# Patient Record
Sex: Female | Born: 1955 | Race: White | Hispanic: No | Marital: Married | Smoking: Never smoker
Health system: Southern US, Community
[De-identification: ages and names within clinical notes are randomized; demographics above are authoritative.]

## PROBLEM LIST (undated history)

## (undated) DIAGNOSIS — E162 Hypoglycemia, unspecified: Secondary | ICD-10-CM

## (undated) DIAGNOSIS — F32A Depression, unspecified: Secondary | ICD-10-CM

## (undated) DIAGNOSIS — R0981 Nasal congestion: Secondary | ICD-10-CM

## (undated) DIAGNOSIS — F329 Major depressive disorder, single episode, unspecified: Secondary | ICD-10-CM

## (undated) DIAGNOSIS — K589 Irritable bowel syndrome without diarrhea: Secondary | ICD-10-CM

## (undated) HISTORY — DX: Depression, unspecified: F32.A

## (undated) HISTORY — PX: TOTAL HIP ARTHROPLASTY: SHX124

## (undated) HISTORY — DX: Major depressive disorder, single episode, unspecified: F32.9

---
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
## 1985-07-06 HISTORY — PX: EAR CANALOPLASTY: SHX1481

## 1998-06-20 ENCOUNTER — Ambulatory Visit (HOSPITAL_BASED_OUTPATIENT_CLINIC_OR_DEPARTMENT_OTHER): Admission: RE | Admit: 1998-06-20 | Discharge: 1998-06-20 | Payer: Self-pay | Admitting: *Deleted

## 1998-07-06 HISTORY — PX: TYMPANOMASTOIDECTOMY: SHX34

## 1999-05-18 ENCOUNTER — Emergency Department (HOSPITAL_COMMUNITY): Admission: EM | Admit: 1999-05-18 | Discharge: 1999-05-18 | Payer: Self-pay | Admitting: Emergency Medicine

## 1999-05-19 ENCOUNTER — Encounter: Payer: Self-pay | Admitting: Emergency Medicine

## 1999-08-18 ENCOUNTER — Emergency Department (HOSPITAL_COMMUNITY): Admission: EM | Admit: 1999-08-18 | Discharge: 1999-08-19 | Payer: Self-pay | Admitting: Emergency Medicine

## 1999-08-19 ENCOUNTER — Encounter: Payer: Self-pay | Admitting: Emergency Medicine

## 2000-11-28 ENCOUNTER — Emergency Department (HOSPITAL_COMMUNITY): Admission: EM | Admit: 2000-11-28 | Discharge: 2000-11-28 | Payer: Self-pay | Admitting: Emergency Medicine

## 2003-07-07 HISTORY — PX: OTHER SURGICAL HISTORY: SHX169

## 2003-11-15 ENCOUNTER — Ambulatory Visit (HOSPITAL_COMMUNITY): Admission: RE | Admit: 2003-11-15 | Discharge: 2003-11-15 | Payer: Self-pay | Admitting: *Deleted

## 2004-01-08 ENCOUNTER — Other Ambulatory Visit: Admission: RE | Admit: 2004-01-08 | Discharge: 2004-01-08 | Payer: Self-pay | Admitting: Obstetrics and Gynecology

## 2004-04-06 ENCOUNTER — Emergency Department: Payer: Self-pay | Admitting: Emergency Medicine

## 2004-05-13 ENCOUNTER — Ambulatory Visit: Payer: Self-pay | Admitting: Family Medicine

## 2005-10-14 ENCOUNTER — Inpatient Hospital Stay (HOSPITAL_COMMUNITY): Admission: RE | Admit: 2005-10-14 | Discharge: 2005-10-17 | Payer: Self-pay | Admitting: Orthopedic Surgery

## 2005-11-10 ENCOUNTER — Encounter: Payer: Self-pay | Admitting: Orthopedic Surgery

## 2005-12-04 ENCOUNTER — Encounter: Payer: Self-pay | Admitting: Orthopedic Surgery

## 2006-03-14 ENCOUNTER — Ambulatory Visit (HOSPITAL_COMMUNITY): Admission: RE | Admit: 2006-03-14 | Discharge: 2006-03-14 | Payer: Self-pay | Admitting: Emergency Medicine

## 2006-08-02 ENCOUNTER — Encounter: Payer: Self-pay | Admitting: Family Medicine

## 2008-10-15 ENCOUNTER — Emergency Department (HOSPITAL_COMMUNITY): Admission: EM | Admit: 2008-10-15 | Discharge: 2008-10-15 | Payer: Self-pay | Admitting: Emergency Medicine

## 2009-11-22 ENCOUNTER — Ambulatory Visit: Payer: Self-pay | Admitting: Family Medicine

## 2009-11-22 DIAGNOSIS — F329 Major depressive disorder, single episode, unspecified: Secondary | ICD-10-CM

## 2009-11-22 DIAGNOSIS — F32A Depression, unspecified: Secondary | ICD-10-CM | POA: Insufficient documentation

## 2009-11-28 ENCOUNTER — Telehealth: Payer: Self-pay | Admitting: Family Medicine

## 2009-11-28 ENCOUNTER — Ambulatory Visit: Payer: Self-pay | Admitting: Family Medicine

## 2009-11-28 ENCOUNTER — Encounter: Payer: Self-pay | Admitting: Family Medicine

## 2009-11-28 LAB — CONVERTED CEMR LAB
ALT: 19 units/L (ref 0–35)
Albumin: 4.4 g/dL (ref 3.5–5.2)
BUN: 13 mg/dL (ref 6–23)
Basophils Absolute: 0 10*3/uL (ref 0.0–0.1)
Chloride: 103 meq/L (ref 96–112)
Creatinine, Ser: 0.67 mg/dL (ref 0.40–1.20)
Eosinophils Relative: 1 % (ref 0–5)
Glucose, Bld: 100 mg/dL — ABNORMAL HIGH (ref 70–99)
Hemoglobin: 13.8 g/dL (ref 12.0–15.0)
Lymphocytes Relative: 43 % (ref 12–46)
Neutro Abs: 2.9 10*3/uL (ref 1.7–7.7)
Neutrophils Relative %: 48 % (ref 43–77)
Sodium: 140 meq/L (ref 135–145)
WBC: 6 10*3/uL (ref 4.0–10.5)
hCG, Beta Chain, Quant, S: <2 milliintl units/mL

## 2009-11-29 ENCOUNTER — Encounter: Payer: Self-pay | Admitting: Family Medicine

## 2009-12-06 ENCOUNTER — Ambulatory Visit: Payer: Self-pay | Admitting: Family Medicine

## 2009-12-17 ENCOUNTER — Ambulatory Visit: Payer: Self-pay | Admitting: Family Medicine

## 2009-12-17 ENCOUNTER — Encounter: Payer: Self-pay | Admitting: Family Medicine

## 2009-12-18 ENCOUNTER — Ambulatory Visit: Payer: Self-pay | Admitting: Family Medicine

## 2009-12-18 LAB — CONVERTED CEMR LAB
AST: 19 units/L (ref 0–37)
Albumin: 4.6 g/dL (ref 3.5–5.2)
Alkaline Phosphatase: 79 units/L (ref 39–117)
BUN: 13 mg/dL (ref 6–23)
Basophils Relative: 0 % (ref 0–1)
Calcium: 9.6 mg/dL (ref 8.4–10.5)
Chloride: 105 meq/L (ref 96–112)
HCT: 40.9 % (ref 36.0–46.0)
HDL: 40 mg/dL (ref >39)
Lymphocytes Relative: 43 % (ref 12–46)
Lymphs Abs: 2.2 10*3/uL (ref 0.7–4.0)
MCV: 93.4 fL (ref 78.0–100.0)
Monocytes Absolute: 0.4 10*3/uL (ref 0.1–1.0)
RBC: 4.38 M/uL (ref 3.87–5.11)
Total Bilirubin: 0.8 mg/dL (ref 0.3–1.2)
Total CHOL/HDL Ratio: 4.5
Triglycerides: 130 mg/dL (ref <150)
VLDL: 26 mg/dL (ref 0–40)
WBC: 5.2 10*3/uL (ref 4.0–10.5)

## 2010-03-03 ENCOUNTER — Ambulatory Visit: Payer: Self-pay | Admitting: Family Medicine

## 2010-03-03 ENCOUNTER — Inpatient Hospital Stay (HOSPITAL_COMMUNITY): Admission: AD | Admit: 2010-03-03 | Discharge: 2010-03-04 | Payer: Self-pay | Admitting: Family Medicine

## 2010-03-04 ENCOUNTER — Encounter: Payer: Self-pay | Admitting: Sports Medicine

## 2010-03-04 ENCOUNTER — Ambulatory Visit: Payer: Self-pay | Admitting: Vascular Surgery

## 2010-03-12 ENCOUNTER — Ambulatory Visit: Payer: Self-pay | Admitting: Family Medicine

## 2010-03-12 ENCOUNTER — Encounter: Payer: Self-pay | Admitting: Sports Medicine

## 2010-03-12 LAB — CONVERTED CEMR LAB
ALT: 14 units/L (ref 0–35)
AST: 22 units/L (ref 0–37)
Albumin: 4.6 g/dL (ref 3.5–5.2)
Alkaline Phosphatase: 77 units/L (ref 39–117)
BUN: 17 mg/dL (ref 6–23)
Basophils Absolute: 0 K/uL (ref 0.0–0.1)
Basophils Relative: 0 % (ref 0–1)
Bilirubin Urine: NEGATIVE
CO2: 25 meq/L (ref 19–32)
Calcium: 9.6 mg/dL (ref 8.4–10.5)
Chloride: 103 meq/L (ref 96–112)
Creatinine, Ser: 0.65 mg/dL (ref 0.40–1.20)
Eosinophils Absolute: 0.1 10*3/uL (ref 0.0–0.7)
Eosinophils Relative: 1 % (ref 0–5)
Glucose, Bld: 84 mg/dL (ref 70–99)
Glucose, Urine, Semiquant: NEGATIVE
HCT: 41.6 % (ref 36.0–46.0)
Hemoglobin: 13.7 g/dL (ref 12.0–15.0)
Ketones, urine, test strip: NEGATIVE
Lipase: 21 U/L (ref 0–75)
Lymphocytes Relative: 41 % (ref 12–46)
Lymphs Abs: 3 10*3/uL (ref 0.7–4.0)
MCHC: 32.9 g/dL (ref 30.0–36.0)
MCV: 95.6 fL (ref 78.0–100.0)
Monocytes Absolute: 0.5 10*3/uL (ref 0.1–1.0)
Monocytes Relative: 7 % (ref 3–12)
Neutro Abs: 3.6 K/uL (ref 1.7–7.7)
Neutrophils Relative %: 50 % (ref 43–77)
Nitrite: NEGATIVE
Platelets: 238 10*3/uL (ref 150–400)
Potassium: 3.7 meq/L (ref 3.5–5.3)
Protein, U semiquant: NEGATIVE
RBC: 4.35 M/uL (ref 3.87–5.11)
RDW: 12.9 % (ref 11.5–15.5)
Sodium: 140 meq/L (ref 135–145)
Specific Gravity, Urine: 1.015
Total Bilirubin: 0.5 mg/dL (ref 0.3–1.2)
Total Protein: 7.5 g/dL (ref 6.0–8.3)
Urobilinogen, UA: 0.2
WBC: 7.2 10*3/microliter (ref 4.0–10.5)
pH: 5.5

## 2010-04-28 ENCOUNTER — Emergency Department (HOSPITAL_COMMUNITY): Admission: EM | Admit: 2010-04-28 | Discharge: 2010-04-28 | Payer: Self-pay | Admitting: Emergency Medicine

## 2010-05-14 ENCOUNTER — Ambulatory Visit: Payer: Self-pay | Admitting: Family Medicine

## 2010-05-14 DIAGNOSIS — N390 Urinary tract infection, site not specified: Secondary | ICD-10-CM | POA: Insufficient documentation

## 2010-05-14 LAB — CONVERTED CEMR LAB
Bilirubin Urine: NEGATIVE
Glucose, Urine, Semiquant: NEGATIVE
Ketones, urine, test strip: NEGATIVE
Nitrite: NEGATIVE
Protein, U semiquant: NEGATIVE
Specific Gravity, Urine: >=1.03
Urobilinogen, UA: 1
pH: 6

## 2010-05-15 ENCOUNTER — Encounter: Payer: Self-pay | Admitting: Sports Medicine

## 2010-05-28 ENCOUNTER — Ambulatory Visit: Payer: Self-pay | Admitting: Family Medicine

## 2010-06-13 ENCOUNTER — Ambulatory Visit: Payer: Self-pay

## 2010-07-27 ENCOUNTER — Encounter: Payer: Self-pay | Admitting: Internal Medicine

## 2010-07-28 ENCOUNTER — Encounter: Payer: Self-pay | Admitting: Orthopedic Surgery

## 2010-08-05 NOTE — Assessment & Plan Note (Signed)
Summary: uti/bmc (pt is working with billing off for cert)/bmc   Vital Signs:  Patient profile:   55 year old female Height:      65 inches Weight:      134 pounds BMI:     22.38 Temp:     97.9 degrees F oral Pulse rate:   88 / minute BP sitting:   112 / 75  (left arm) Cuff size:   regular  Vitals Entered By: Jimmy Footman, CMA (May 14, 2010 11:22 AM) CC: uti f/u, left side abdominal pain Is Patient Diabetic? No Pain Assessment Patient in pain? yes     Location: abdomen Intensity: 4 Type: sharp   Primary Care Provider:  Marisue Ivan, MD  CC:  uti f/u and left side abdominal pain.  History of Present Illness: 55 yo female with LLQ pain.  LLQ pain:  Present 1 week.  Wondering if she has a UTI.  Had also been constipated and started OTC stool softeners.  They were hard at first and she had to strain, no hematochezia.  Her pain and constipation is MUCH better with the stool softener.  No fevers, chills, N/V/D, rash.  No dysuria, no changes in quantity or quality of urine, no CVA pain.  Depression:  Has long history of this for which she takes zoloft occasionally.  PHQ9: 15, described as very difficult.  Amenable to going on SSRI regularly.  No SI/HI.  Endorses that she can't concentrate and thinks she is ADHD.  Habits & Providers  Alcohol-Tobacco-Diet     Tobacco Status: never  Exercise-Depression-Behavior     Have you felt down or hopeless? yes     Have you felt little pleasure in things? yes     Depression Counseling: further diagnostic testing and/or other treatment is indicated  Current Medications (verified): 1)  Celexa 20 Mg Tabs (Citalopram Hydrobromide) .... One Tab By Mouth Qhs 2)  Cvs Nat Fiber Laxative 100 % Powd (Psyllium) .... Use As Directed Three Times A Day 3)  Colace 100 Mg Caps (Docusate Sodium) .... One Tab By Mouth Two Times A Day  Allergies (verified): 1)  ! Codeine 2)  ! Hydrocodone 3)  ! Sulfa  Review of Systems       See  HPI  Physical Exam  General:  Well-developed,well-nourished,in no acute distress; alert,appropriate and cooperative throughout examination Abdomen:  TTP LLQ, no rebound and tenderness resolves with distraction.  Good BS.  No CVAT. Psych:  Cognition and judgment appear intact. Alert and cooperative with normal attention span and concentration. No apparent delusions, illusions, hallucinations   Impression & Recommendations:  Problem # 1:  DYSURIA (ICD-788.1) Assessment New Symptoms likely related to constipation. She has bacteriuria but asymptomatic so will not treat. Cont Colace Rx'ed fiber laxative.  Orders: Urinalysis-[facility] (00000) Urine Culture-[facility] ([id number]) [facility]- Est  Level 4 (02725)  Problem # 2:  DEPRESSION, RECURRENT (ICD-311) Assessment: Deteriorated Changed to celexa 20. She has been non-compliant with zoloft. Agrees to take celexa daily. RTC 2 weeks for re-eval. PHQ9 with each depression visit.  Her updated medication list for this problem includes:    Celexa 20 Mg Tabs (Citalopram hydrobromide) ..... One tab by mouth qhs  Orders: [facility]- Est  Level 4 (99214)  Complete Medication List: 1)  Celexa 20 Mg Tabs (Citalopram hydrobromide) .... One tab by mouth qhs 2)  Cvs Nat Fiber Laxative 100 % Powd (Psyllium) .... Use as directed three times a day 3)  Colace 100 Mg Caps (Docusate sodium) .Marland KitchenMarland KitchenMarland Kitchen  One tab by mouth two times a day  Patient Instructions: 1)  Great to see you, 2)  Celexa at bedtime EVER NIGHT! 3)  Fiber three times a day 4)  Come back to see me in 2 weeks to discuss the celexa (it can take 4-6 weeks to really work well. 5)  -Dr. Karie Schwalbe. Prescriptions: [facility]S NAT FIBER LAXATIVE 100 % POWD (PSYLLIUM) Use as directed three times a day  #1 box x 6   Entered and Authorized by:   Rodney Langton MD   Signed by:   Rodney Langton MD on 05/14/2010   Method used:   Print then Give to Patient   RxID:   [id number] CELEXA 20 MG TABS (CITALOPRAM  HYDROBROMIDE) One tab by mouth qHS  #90 x 0   Entered and Authorized by:   Rodney Langton MD   Signed by:   Rodney Langton MD on 05/14/2010   Method used:   Print then Give to Patient   RxID:   [id number]    Orders Added: 1)  Urinalysis-[facility] [00000] 2)  Urine Culture-[facility] [[id number]] 3)  [facility]- Est  Level 4 [41324]    Laboratory Results   Urine Tests  Date/Time Received: May 14, 2010 11:09 AM  Date/Time Reported: May 14, 2010 11:31 AM   Routine Urinalysis   Color: yellow Appearance: Clear Glucose: negative   (Normal Range: Negative) Bilirubin: negative   (Normal Range: Negative) Ketone: negative   (Normal Range: Negative) Spec. Gravity: >=1.030   (Normal Range: 1.003-1.035) Blood: trace-lysed   (Normal Range: Negative) pH: 6.0   (Normal Range: 5.0-8.0) Protein: negative   (Normal Range: Negative) Urobilinogen: 1.0   (Normal Range: 0-1) Nitrite: negative   (Normal Range: Negative) Leukocyte Esterace: small   (Normal Range: Negative)  Urine Microscopic WBC/HPF: 0-3 RBC/HPF: 0-3 Bacteria/HPF: 1+ Mucous/HPF: 1+ Epithelial/HPF: 1-5 Crystals/HPF: 5-10 calcium oxalate    Comments: ...........test performed by...........Marland KitchenTerese Door, CMA

## 2010-08-05 NOTE — Progress Notes (Signed)
  Phone Note Outgoing Call   Call placed by: Angelena Sole MD,  Nov 28, 2009 12:05 PM Summary of Call: Spoke with patient regarding lab results and told her that all of them were normal.  Pt voiced understanding and had no questions.

## 2010-08-05 NOTE — Assessment & Plan Note (Signed)
Summary: f/u depression- improved   Vital Signs:  Patient profile:   55 year old female Height:      65 inches Weight:      136.5 pounds BMI:     22.80 Temp:     97.6 degrees F oral Pulse rate:   77 / minute BP sitting:   100 / 66  (left arm) Cuff size:   regular  Vitals Entered By: Gladstone Pih (December 18, 2009 8:34 AM) CC: F/U depression and med change Is Patient Diabetic? No Pain Assessment Patient in pain? no      Comments Notices a change with increase of medication   Primary Care Provider:  Marisue Ivan, MD  CC:  F/U depression and med change.  History of Present Illness: 55yo F here for f/u depression  Depression: Reports improvement of symptoms on Zoloft 100mg  daily.  Sleeping less, more energy, and more motivated to do activities and work.  No SI/HI.    Preventative: Deferred all screening due to financial restraints.  Mammogram and colonoscopy has been ordered in the past.  Habits & Providers  Alcohol-Tobacco-Diet     Tobacco Status: never  Current Medications (verified): 1)  Sertraline Hcl 100 Mg Tabs (Sertraline Hcl) .Marland Kitchen.. 1 Tab By Mouth Daily  Allergies (verified): No Known Drug Allergies  Review of Systems      See HPI  Physical Exam  General:  VS Reviewed. Well appearing, NAD.  Psych:  Appears more happy than prior exam. Good eye contact. Appropriate affect. Denies any SI/HI   Impression & Recommendations:  Problem # 1:  DEPRESSION, RECURRENT (ICD-311) Assessment Improved  Good response to the increase in zoloft to 100mg  daily. No changes to regimen.  Pt denies any SI/HI.   She is more motivated to work and function in life with less sad days now. Plan for her to f/u in 1-2 months with new provider to reassess symptoms and progress.  Her updated medication list for this problem includes:    Sertraline Hcl 100 Mg Tabs (Sertraline hcl) .Marland Kitchen... 1 tab by mouth daily  Orders: [facility]- Est Level  3 (78938)  Problem # 2:  PREVENTIVE  HEALTH CARE (ICD-V70.0) Assessment: Comment Only Deferred all screening at this time due to financial restraints. [place] records reviewed.  No documentation of last tetanus- will discuss risks and benefits at next visit. Last pap smear documented was 10/13/06- normal Recent labs reviewed-- Anemia- resolved (no hx seen on [place] charts); Nl TSH; No HLD; No DM  Complete Medication List: 1)  Sertraline Hcl 100 Mg Tabs (Sertraline hcl) .Marland Kitchen.. 1 tab by mouth daily  Patient Instructions: 1)  Follow up with new physician in 1-2 months or sooner if needed. 2)  Whenever it is feasible, please obtain the colonoscopy, pap smear, and mammogram.   Prevention & Chronic Care Immunizations   Influenza vaccine: Not documented    Tetanus booster: Not documented    Pneumococcal vaccine: Not documented  Colorectal Screening   Hemoccult: Not documented   Hemoccult action/deferral: Refused  (12/18/2009)    Colonoscopy: Not documented   Colonoscopy action/deferral: GI Referral  (11/22/2009)  Other Screening   Pap smear: normal  (10/13/2006)   Pap smear action/deferral: Deferred  (12/18/2009)   Pap smear due: 10/12/2009    Mammogram: Not documented   Mammogram action/deferral: Ordered  (11/22/2009)   Smoking status: never  (12/18/2009)  Lipids   Total Cholesterol: Not documented   LDL: Not documented   LDL Direct: Not documented   HDL:  Not documented   Triglycerides: Not documented   PAP Result Date:  10/13/2006 PAP Result:  normal PAP Next Due:  3 yr

## 2010-08-05 NOTE — Assessment & Plan Note (Signed)
Summary: hospital f/u [facility]   Vital Signs:  Patient profile:   55 year old female Height:      65 inches Weight:      131 pounds BMI:     21.88 Temp:     98.3 degrees F oral Pulse rate:   76 / minute BP sitting:   109 / 67  (left arm) Cuff size:   regular  Vitals Entered By: Jimmy Footman, CMA (March 12, 2010 10:15 AM) CC: hospital f/u possible stroke Is Patient Diabetic? No   Primary Care Kayen Grabel:  Marisue Ivan, MD  CC:  hospital f/u possible stroke.  History of Present Illness: 75 F here for HFU.  HFU:  Admitted for CVA r/o.  ECHO, MRI/MRA all negative, symptoms resolved.  Pt was in a lot of stress at the time.  Deemed to be more functional rather an organic.    HA:  Pt now c/o R temporal HA, had an episode of visual loss in both eyes years ago, vision returned in 15 mins.  Never happened again.  No nasal drainage, mild cough, no visual changes.  No stiff neck, on fevers/chills, no hx migraines.  Pain is worse with light.  no phonophobia, may see flashing lights before.  Abd pain:  C/o generalized abd pain, no N/V/D/C, no fevers/chills, some dysuria earlier, no rash, no vag discharge.  Habits & Providers  Alcohol-Tobacco-Diet     Tobacco Status: never  Current Medications (verified): 1)  Sertraline Hcl 100 Mg Tabs (Sertraline Hcl) .Marland Kitchen.. 1 Tab By Mouth Daily 2)  Keflex 500 Mg Caps (Cephalexin) .... One Tab Po Two Times A Day X 7 Days 3)  Phenazopyridine Hcl 200 Mg Tabs (Phenazopyridine Hcl) .... One Tab By Mouth Two Times A Day X 3d 4)  Acetaminophen 650 Mg Cr-Tabs (Acetaminophen) .... One Tab By Mouth Q8h As Needed For Headache  Allergies (verified): 1)  ! Codeine 2)  ! Hydrocodone 3)  ! Sulfa  Review of Systems       See HPI  Physical Exam  General:  Well-developed,well-nourished,in no acute distress; alert,appropriate and cooperative throughout examination Lungs:  Normal respiratory effort, chest expands symmetrically. Lungs are clear to auscultation, no  crackles or wheezes. Heart:  Normal rate and regular rhythm. S1 and S2 normal without gallop, murmur, click, rub or other extra sounds. Abdomen:  Bowel sounds positive,abdomen soft and non-tender without masses, organomegaly or hernias noted. Extremities:  No edema. Neurologic:  No cranial nerve deficits noted. Station and gait are normal. Plantar reflexes are down-going bilaterally. DTRs are symmetrical throughout. Sensory, motor and coordinative functions appear intact.   Impression & Recommendations:  Problem # 1:  HEADACHE, TEMPORAL (ICD-784.0) Assessment New Suggestive of migraine, will tx with acetaminophen. With hx temp vision loss and R  temporal HA, will check ESR to ensure not Giant Cell Arteritis.  Her updated medication list for this problem includes:    Acetaminophen 650 Mg Cr-tabs (Acetaminophen) ..... One tab by mouth q8h as needed for headache  Orders: Sed Rate (ESR)-[facility] (85651) [facility]- Est  Level 4 (16109)  Problem # 2:  ABDOMINAL PAIN, GENERALIZED (ICD-789.07) Assessment: New UA suggestive of cystitis.  Will tx with Keflex, phenazopyridine. UCx.  Orders: Comp Met-[facility] [phone]) CBC w/Diff-[facility] [phone]) Urinalysis-[facility] (00000) Lipase-[facility] ([id number]) [facility]- Est  Level 4 (57846) Urine Culture-[facility] ([id number])  Problem # 3:  R/O CVA (ICD-434.91) Assessment: Improved Ruled out, All tests negative.  Orders: [facility]- Est  Level 4 (24401)  Complete Medication List: 1)  Sertraline Hcl 100 Mg Tabs (Sertraline hcl) .Marland Kitchen.. 1 tab by mouth daily 2)  Keflex 500 Mg Caps (Cephalexin) .... One tab po two times a day x 7 days 3)  Phenazopyridine Hcl 200 Mg Tabs (Phenazopyridine hcl) .... One tab by mouth two times a day x 3d 4)  Acetaminophen 650 Mg Cr-tabs (Acetaminophen) .... One tab by mouth q8h as needed for headache  Patient Instructions: 1)  Your belly pain is likely coming from a UTI. 2)  Use the antibiotic prescribed and this should clear it up, come back if pain no  better. 3)  Will let you know if any of the bloodwork is abnormal. 4)  Will rx acetaminophen 650 for your headaches. 5)  -Dr. Karie Schwalbe. Prescriptions: ACETAMINOPHEN 650 MG CR-TABS (ACETAMINOPHEN) One tab by mouth q8h as needed for headache  #60 x 0   Entered and Authorized by:   Rodney Langton MD   Signed by:   Rodney Langton MD on 03/12/2010   Method used:   [facility] to        [facility] [phone]* (retail)       [address]       [place], [place]  [address]       Ph: [phone] or [phone]       Fax: [phone]   RxID:   [id number] PHENAZOPYRIDINE HCL 200 MG TABS (PHENAZOPYRIDINE HCL) One tab by mouth two times a day x 3d  #6 x 0   Entered and Authorized by:   Rodney Langton MD   Signed by:   Rodney Langton MD on 03/12/2010   Method used:   [facility] to        [facility] [phone]* (retail)       [address]       [place], [place]  [address]       Ph: [phone] or [phone]       Fax: [phone]   RxID:   [id number] KEFLEX 500 MG CAPS (CEPHALEXIN) One tab PO two times a day x 7 days  #14 x 0   Entered and Authorized by:   Rodney Langton MD   Signed by:   Rodney Langton MD on 03/12/2010   Method used:   Electronically to        [facility] [phone]* (retail)       [address]       [place], [place]  [address]       Ph: [phone] or [phone]       Fax: [phone]   RxID:   [id number]   Laboratory Results   Urine Tests  Date/Time Received: March 12, 2010 10:45 AM  Date/Time Reported: March 12, 2010 11:15 AM   Routine Urinalysis   Color: yellow Appearance: Clear Glucose: negative   (Normal Range: Negative) Bilirubin: negative   (Normal Range: Negative) Ketone: negative   (Normal Range: Negative) Spec. Gravity: 1.015   (Normal Range: 1.003-1.035) Blood: trace-intact   (Normal Range: Negative) pH: 5.5   (Normal Range: 5.0-8.0) Protein:  negative   (Normal Range: Negative) Urobilinogen: 0.2   (Normal Range: 0-1) Nitrite: negative   (Normal Range: Negative) Leukocyte Esterace: large   (Normal Range: Negative)  Urine Microscopic WBC/HPF: 10-20 RBC/HPF: 0-3 Bacteria/HPF: 3+ Epithelial/HPF: 1-5    Comments: Occ transitional epi present ...........test performed by...........Marland KitchenTerese Door,  CMA   Blood Tests   Date/Time Received: March 12, 2010 11:45 AM  Date/Time Reported: March 12, 2010 11:59 AM   SED rate: 27 mm/hr  Comments: ...............test performed by......Marland KitchenBonnie A. [place], MLS (ASCP)cm

## 2010-08-05 NOTE — Assessment & Plan Note (Signed)
Summary: F/U  KH   Vital Signs:  Patient profile:   55 year old female Weight:      135.5 pounds Temp:     98.3 degrees F oral Pulse rate:   88 / minute Pulse rhythm:   regular BP sitting:   116 / 69  (left arm) Cuff size:   regular  Vitals Entered By: Loralee Pacas CMA (May 28, 2010 9:06 AM) CC: follow-up visit Is Patient Diabetic? No   Primary Care Provider:  Rodney Langton MD  CC:  follow-up visit.  History of Present Illness: Kelly Espinoza comes in for fu of depression.  She is currently on celexa 20, this is her first fu visit for this.  Her PHQ9 score was 15 at the last visit.  She is doing well with the medication qHS, no problems with nausea, excessive drowsiness, HA, or rashes.  She did have an episode last week where a neice made fun of her crocheting and clothes and this made her feel bullied and bad.  She feels that the neice needs to be put in her place.  She did however defend with neice when another family member made a comment about the neice.  She also notes that she missed her dose of celexa the night before and this may have affected her coping strategies.  Overall she feels good and feels that without the aforementioned event, her PHQ9 score would have been "0".  No SI/HI.  Habits & Providers  Alcohol-Tobacco-Diet     Tobacco Status: never  Exercise-Depression-Behavior     Have you felt down or hopeless? yes     Have you felt little pleasure in things? yes     Depression Counseling: further diagnostic testing and/or other treatment is indicated     Seat Belt Use: always  Current Medications (verified): 1)  Celexa 20 Mg Tabs (Citalopram Hydrobromide) .... One Tab By Mouth Qhs 2)  Cvs Nat Fiber Laxative 100 % Powd (Psyllium) .... Use As Directed Three Times A Day 3)  Colace 100 Mg Caps (Docusate Sodium) .... One Tab By Mouth Two Times A Day  Allergies (verified): 1)  ! Codeine 2)  ! Hydrocodone 3)  ! Sulfa  Social History: Risk analyst Use:   always  Review of Systems       See hPI  Physical Exam  General:  Well-developed,well-nourished,in no acute distress; alert,appropriate and cooperative throughout examination Lungs:  Normal respiratory effort, chest expands symmetrically. Lungs are clear to auscultation, no crackles or wheezes. Heart:  Normal rate and regular rhythm. S1 and S2 normal without gallop, murmur, click, rub or other extra sounds. Additional Exam:  PHQ9: 11   Impression & Recommendations:  Problem # 1:  DEPRESSION, RECURRENT (ICD-311) Assessment Improved Cont current dose. PHQ9 15->11 No ADEs. PHQ9 at every visit. RTC 2 weeks to reasses.  Her updated medication list for this problem includes:    Celexa 20 Mg Tabs (Citalopram hydrobromide) ..... One tab by mouth qhs  Orders: [facility]- Est Level  3 (16109)  Complete Medication List: 1)  Celexa 20 Mg Tabs (Citalopram hydrobromide) .... One tab by mouth qhs 2)  Cvs Nat Fiber Laxative 100 % Powd (Psyllium) .... Use as directed three times a day 3)  Colace 100 Mg Caps (Docusate sodium) .... One tab by mouth two times a day   Orders Added: 1)  [facility]- Est Level  3 [60454]

## 2010-08-05 NOTE — Progress Notes (Signed)
Summary: triage  Phone Note Call from Patient Call back at Home Phone [phone]   Caller: Patient Summary of Call: pt feels "puny" and doesn't feel good.  nausea for the last week.  wants to come in this am Initial call taken by: De Nurse,  Nov 28, 2009 8:32 AM  Follow-up for Phone Call        states her husband has to drive her as she does not think she can do it herself. will be here in about 30 minutes for work in Follow-up by: Golden Circle RN,  Nov 28, 2009 8:36 AM

## 2010-08-05 NOTE — Initial Assessments (Signed)
Summary: possible stroke per pt/eo   Vital Signs:  Patient profile:   55 year old female Weight:      128.5 pounds Temp:     98 degrees F oral Pulse rate:   70 / minute Pulse rhythm:   regular BP sitting:   94 / 68  (left arm) Cuff size:   regular  Vitals Entered By: Loralee Pacas CMA (March 03, 2010 10:14 AM) CC: possible stroke   Primary Care Marquitta Persichetti:  Marisue Ivan, MD  CC:  possible stroke.  History of Present Illness: 55 yo female with depression.  Last normal last night around 9-10pm.  Noted an abnormal sensation in back of head described as an "earthquake" in head.  Then developed slurred speech and numbness on R side of face.  Also with some difficulty with coordination and feeling off balance.  Went to sleep, woke up this morning with symptoms unresolved and came to [facility].  No CP, no SOB, no visual changes, no stool/urinary symptoms, no HA currently, no fevers/chills.  Note: Father had CVA in his 89's.  Habits & Providers  Alcohol-Tobacco-Diet     Tobacco Status: never  Current Medications (verified): 1)  Sertraline Hcl 100 Mg Tabs (Sertraline Hcl) .Marland Kitchen.. 1 Tab By Mouth Daily  Allergies (verified): 1)  ! Codeine 2)  ! Hydrocodone 3)  ! 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  Hx of CVA in his 23s Mother- Mother in her 63s w/ DM Son- Autism Daughter- Autism  Social History: Reviewed history from 11/28/2009 and no changes required. Lives with husband Maurine Minister), son Cristal Deer), and daughter Judeth Cornfield). 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  Eyes:  No corneal or conjunctival inflammation noted. EOMI. Perrla.  Ears:  External ear exam shows no significant lesions or deformities.   Nose:  External nasal examination shows no deformity or inflammation.  Mouth:  Oral mucosa and oropharynx without lesions or exudates.   Neck:  No deformities, masses, or tenderness noted. Lungs:  Normal respiratory effort, chest expands symmetrically. Lungs are clear to auscultation, no crackles or wheezes. Heart:  Normal rate and regular rhythm. S1 and S2 normal without gallop, murmur, click, rub or other extra sounds. Abdomen:  Bowel sounds positive,abdomen soft and non-tender without masses, organomegaly or hernias noted. Msk:  No deformity or scoliosis noted of thoracic or lumbar spine.   Pulses:  R and L carotid,radial,femoral,dorsalis pedis and posterior tibial pulses are full and equal bilaterally Extremities:  No clubbing, cyanosis, edema, or deformity noted with normal full range of motion of all joints.   Neurologic:  Some hypesthesia in right V2 distribution. Other cranial nerves intact.  Unsteady gait with tandem walking.  Plantar reflexes are down-going bilaterally. DTRs are symmetrical throughout. Strength 5/5 bilaterally.  Dysdiadochokinesis noted on L with rapid alternating movements.  No finger or foot dysmetria.   Psych:  Tearful.   Impression & Recommendations:  Problem # 1:  R/O CVA (  UEA-540.98) Assessment New  With slurred speech, R facial numbness, this is concerning for left brain CVA.  Still within 24h but out of TPA window.  Dysdiadochokinesis present and raises concern for cerebellar vs pontine etiology.  Will admit to tele, stroke orders filled out.  Swallow screen, CT head  STAT, MRI/MRA head/neck.  2D ECHO.  ASA 325.  Will call neurology for further advice.  Orders: [facility]- Est Level  5 (11914)  Problem # 2:  DEPRESSION, RECURRENT (ICD-311) Assessment: Unchanged  Continue Zoloft.  Her updated medication list for this problem includes:    Sertraline Hcl 100 Mg Tabs (Sertraline hcl) .Marland Kitchen... 1 tab by mouth daily  Orders: [facility]- Est Level  5 (78295)  Problem # 3:  FEN/GI HH regular diet when passed swallow screen.  SLIV  Problem # 4:  PPx Heparin/Protonix  Problem # 5:  DISPO When above workup completed.  Problem # 6:  CODE FULL CODE.  Complete Medication List: 1)  Sertraline Hcl 100 Mg Tabs (Sertraline hcl) .Marland Kitchen.. 1 tab by mouth daily  Appended Document: possible stroke per pt/eo Dictation # [id number]

## 2010-08-05 NOTE — Assessment & Plan Note (Signed)
Summary: 55yo F new patient   Vital Signs:  Patient profile:   55 year old female Height:      65 inches Weight:      141.7 pounds BMI:     23.67 Temp:     98.1 degrees F oral Pulse rate:   91 / minute BP sitting:   125 / 79  (left arm) Cuff size:   regular  Vitals Entered By: Garen Grams LPN (Nov 22, 2009 1:54 PM) CC: recurrent depression Is Patient Diabetic? No Pain Assessment Patient in pain? no        Primary Care Provider:  Marisue Ivan, MD  CC:  recurrent depression.  History of Present Illness: 55yo F here as a new pt  Concerns: She is worried that her depression is reoccurring b/c of her current situation.  States that she has to put her son in a group home which has been very stressful.  She has been on zoloft 100mg  in the past that worked well for her.  Reports feeling sad on most days.  Tearful.  Dec appetite.  No thoughts of hurting herself or others.    Habits & Providers  Alcohol-Tobacco-Diet     Tobacco Status: never  Current Medications (verified): 1)  Sertraline Hcl 50 Mg Tabs (Sertraline Hcl) .Marland Kitchen.. 1 Tab By Mouth Daily  Allergies (verified): No Known Drug Allergies  Past History:  Past Medical History: G3P2012 (Stillborn in 1988) Recurrent Depression  Past Surgical History: Mastoidectomy & L ear Tympanoplasty 1987 C-section 1991 & 1993 Canalplasty & Tympanoplasty of L ear 1999 R ear Canalplasty 2000 Tubal reversal 2005 L hip replacement 2009  Family History: Father- Deceased.  Hx of CVA. Mother- Mother in her 83s w/ DM Son- Autism Daughter- Autism  Social History: Lives with husband Maurine Minister), son Cristal Deer), and daughter Judeth Cornfield). 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 Well appearing,  NAD.  Head:  Normocephalic and atraumatic without obvious abnormalities. No apparent alopecia or balding. Eyes:  No corneal or conjunctival inflammation noted. EOMI. Perrla. Funduscopic exam benign, without hemorrhages, exudates or papilledema. Vision grossly normal. Mouth:  Oral mucosa and oropharynx without lesions or exudates.  Teeth in good repair. Neck:  supple, full ROM, no goiter or mass  Lungs:  Normal respiratory effort, chest expands symmetrically. Lungs are clear to auscultation, no crackles or wheezes. Heart:  Normal rate and regular rhythm. S1 and S2 normal without gallop, murmur, click, rub or other extra sounds. Abdomen:  Soft, NT, ND, no HSM, active BS  Msk:  no joint effusions or erythema Extremities:  no edema of LE Neurologic:  no focal deficits   Impression & Recommendations:  Problem # 1:  DEPRESSION, RECURRENT (ICD-311) Assessment Deteriorated Worsening symptoms over the past few weeks. No SI/HI.  Will start back on Zoloft but lower dose.   Will reassess in 2 weeks.  Future Orders: Comp Met-[facility] [id number]) ... 11/12/2010 Lipid-[facility] ([id number]) ... 11/12/2010  Her updated medication list for this problem includes:    Sertraline Hcl 50 Mg Tabs (Sertraline hcl) .Marland Kitchen... 1 tab by mouth daily  Problem # 2:  ANEMIA (ICD-285.9) Assessment: Unchanged Obtain CBC w/ diff  Future Orders: CBC w/Diff-[facility] ([id number]) ... 11/12/2010  Problem # 3:  SCREENING OTHER&UNSPEC CARDIOVASCULAR CONDITIONS (ICD-V81.2) Assessment: Comment  Only Screen for HLD and DM.   Future Orders: Comp Met-[facility] [id number]) ... 11/12/2010 Lipid-[facility] ([id number]) ... 11/12/2010  Problem # 4:  PREVENTIVE HEALTH CARE (ICD-V70.0) Assessment: Comment Only Will order mammogram and colonoscopy. will obtain records from [place] to see her last pap smear and tetanus shot.  Complete Medication List: 1)  Sertraline Hcl 50 Mg Tabs (Sertraline hcl) .Marland Kitchen.. 1 tab by mouth daily  Other  Orders: Colonoscopy (Colon) Mammogram (Screening) (Mammo) Future Orders: TSH-[facility] ([id number]) ... 11/12/2010  Patient Instructions: 1)  I want you to return next week for lab work (do not eat breakfast prior). 2)  Follow up in 2 weeks to reassess depression. 3)  We started you back on Zoloft today. 4)  We are scheduling you for a mammogram and colonoscopy. Prescriptions: SERTRALINE HCL 50 MG TABS (SERTRALINE HCL) 1 tab by mouth daily  #30 x 1   Entered and Authorized by:   Marisue Ivan  MD   Signed by:   Marisue Ivan  MD on 11/22/2009   Method used:   [facility]        [facility] [phone]* (retail)       [address]       [place], [place]  [address]       Ph: [phone] or [phone]       Fax: [phone]   RxID:   [id number]    Prevention & Chronic Care Immunizations   Influenza vaccine: Not documented    Tetanus booster: Not documented    Pneumococcal vaccine: Not documented  Colorectal Screening   Hemoccult: Not documented    Colonoscopy: Not documented   Colonoscopy action/deferral: GI Referral  (11/22/2009)  Other Screening   Pap smear: Not documented    Mammogram: Not documented   Mammogram action/deferral: Ordered  (11/22/2009)   Smoking status: never  (11/22/2009)  Lipids   Total Cholesterol: Not documented   LDL: Not documented   LDL Direct: Not documented   HDL: Not documented   Triglycerides: Not documented   Nursing Instructions: Screening colonoscopy ordered Schedule screening mammogram (see order)

## 2010-08-05 NOTE — Miscellaneous (Signed)
Summary: ROI  ROI   Imported By: De Nurse 12/04/2009 16:09:32  _____________________________________________________________________  External Attachment:    Type:   Image     Comment:   External Document
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
## 2010-08-05 NOTE — Assessment & Plan Note (Signed)
Summary: depression- worsened   Vital Signs:  Patient profile:   55 year old female Height:      65 inches Weight:      138 pounds Temp:     98.6 degrees F Pulse rate:   86 / minute BP supine:   100 / 72  Primary Care Provider:  Marisue Ivan, MD  CC:  worsening depression.  History of Present Illness: 55yo F c/o worsening depression  Depression: States worsening condition despite starting zoloft 50mg  2 weeks ago.  States that she is under more stress b/c she has lost her job and her son Cristal Deer is leaving.  Denies any thoughts of hurting herself or others.  She is wanting to increase her zoloft and denies any adverse effects today.    Current Medications (verified): 1)  Sertraline Hcl 100 Mg Tabs (Sertraline Hcl) .Marland Kitchen.. 1 Tab By Mouth Daily 2)  Omeprazole 20 Mg Cpdr (Omeprazole) .... Take 1 Tab By Mouth Daily 3)  Promethazine Hcl 25 Mg Tabs (Promethazine Hcl) .... Take 1 Tab By Mouth Every 6 Hours As Needed For Nausea  Allergies (verified): No Known Drug Allergies  Review of Systems      See HPI  Physical Exam  General:  VS Reviewed. Tearful middle age female, non ill appearing.  Psych:  tearful, no SI/HI   Impression & Recommendations:  Problem # 1:  DEPRESSION, RECURRENT (ICD-311) Assessment Deteriorated Acutely worsened depression due to current situation. Not suicidal or homicidal. Plan to inc zoloft to 100mg  daily and f/u in 2 weeks to reassess.  Her updated medication list for this problem includes:    Sertraline Hcl 100 Mg Tabs (Sertraline hcl) .Marland Kitchen... 1 tab by mouth daily  Orders: [facility]- Est Level  3 ([address]841)  Complete Medication List: 1)  Sertraline Hcl 100 Mg Tabs (Sertraline hcl) .Marland Kitchen.. 1 tab by mouth daily 2)  Omeprazole 20 Mg Cpdr (Omeprazole) .... Take 1 tab by mouth daily 3)  Promethazine Hcl 25 Mg Tabs (Promethazine hcl) .... Take 1 tab by mouth every 6 hours as needed for nausea  Patient Instructions: 1)  Please schedule a follow-up  appointment in 2 weeks.  2)  Go see Rudell Cobb regarding medication assistance. 3)  We increased the zoloft to 100mg  daily. Prescriptions: SERTRALINE HCL 100 MG TABS (SERTRALINE HCL) 1 tab by mouth daily  #30 x 1   Entered and Authorized by:   Marisue Ivan  MD   Signed by:   Marisue Ivan  MD on 12/06/2009   Method used:   [facility] [address] [phone]* (retail)       [address]       [place], [place]  [address]       Ph: [phone] or [phone]       Fax: [phone]   RxID:   [id number]

## 2010-08-06 ENCOUNTER — Ambulatory Visit: Admit: 2010-08-06 | Payer: Self-pay

## 2010-08-06 ENCOUNTER — Encounter: Payer: Self-pay | Admitting: Sports Medicine

## 2010-08-06 ENCOUNTER — Ambulatory Visit (INDEPENDENT_AMBULATORY_CARE_PROVIDER_SITE_OTHER): Payer: BC Managed Care – PPO | Admitting: Sports Medicine

## 2010-08-06 DIAGNOSIS — F329 Major depressive disorder, single episode, unspecified: Secondary | ICD-10-CM

## 2010-08-06 DIAGNOSIS — F3289 Other specified depressive episodes: Secondary | ICD-10-CM

## 2010-08-06 NOTE — Progress Notes (Signed)
  Subjective:    Patient ID: Kelly Espinoza, female    DOB: 23-Jun-1956, 55 y.o.   MRN: [id number]  HPI Darletta comes in for fu of depression.  She is currently on celexa 20, this is her second fu visit for this.  Her PHQ9 score was 15->11->12 today.  She is doing well with the medication qHS, no problems with nausea, excessive drowsiness, HA, or rashes.  Overall she feels good and feels that her life has been significantly better with the medication and she can cope with problems well.  No SI/HI.  She is currently scheduled for jury duty and feels that the stress of this would likely worsen her depression.  She also [facility]   Review of Systems    Neg except as in HPI Objective:   Physical Exam  Constitutional: She appears well-developed and well-nourished.  Cardiovascular: Normal rate, regular rhythm, normal heart sounds and intact distal pulses.   Pulmonary/Chest: Effort normal and breath sounds normal.  Psychiatric: Her speech is normal and behavior is normal. Judgment and thought content normal. Her mood appears not anxious. Her affect is not angry, not blunt, not labile and not inappropriate. Cognition and memory are normal. She exhibits a depressed mood.       She did have a tearful episode when discussing jury duty.          Assessment & Plan:

## 2010-08-06 NOTE — Assessment & Plan Note (Addendum)
PHQ9 15->11->12 With worsening of depression will increase dose to 40mg . She will get this from [facility] from now on as it is $10 for 3 months vs [facility]. No ADEs. PHQ9 at every visit. RTC 4 weeks to reasses.

## 2010-08-08 NOTE — Letter (Signed)
Summary: EKG  EKG   Imported By: Clydell Hakim 01/02/2010 16:19:48  _____________________________________________________________________  External Attachment:    Type:   Image     Comment:   External Document

## 2010-08-13 NOTE — Assessment & Plan Note (Signed)
Summary: f/u,df   Vital Signs:  Patient profile:   55 year old female Height:      65 inches Weight:      128 pounds Temp:     98.9 degrees F oral Pulse rate:   99 / minute Pulse rhythm:   regular BP sitting:   109 / 74  (left arm) Cuff size:   regular  Vitals Entered By: Loralee Pacas CMA (August 06, 2010 1:51 PM) CC: follow-up visit Comments pt states that she is under distress.she went to court and was not given gaurdianship of her daughter due to paperwork.    Primary Care Provider:  Rodney Langton MD  CC:  follow-up visit.  History of Present Illness: HPI Kelly Espinoza comes in for fu of depression.   She is currently on celexa 20, this is her second fu visit for this.  Her PHQ9 score was 15->11->12 today.  She is doing well with the medication qHS, no problems with nausea, excessive drowsiness, HA, or rashes.  Overall she feels good and feels that her life has been significantly better with the medication and she can cope with problems well.  No SI/HI.  She is currently scheduled for jury duty and feels that the stress of this would likely worsen her depression.  She also paying [facility]     Review of Systems   Neg except as in HPI  Objective:    Physical Exam  Constitutional: She appears well-developed and well-nourished.  Cardiovascular: Normal rate, regular rhythm, normal heart sounds and intact distal pulses.   Pulmonary/Chest: Effort normal and breath sounds normal.  Psychiatric: Her speech is normal and behavior is normal. Judgment and thought content normal. Her mood appears not anxious. Her affect is not angry, not blunt, not labile and not inappropriate. Cognition and memory are normal. She exhibits a depressed mood.       She did have a tearful episode when discussing jury duty.           Assessment & Plan:              DEPRESSION, RECURRENT - [name]  08/06/2010  2:24 PM  Addendum PHQ9 15->11->12 With  worsening of depression will increase dose to 40mg . She will get this from [facility] from now on as it is $10 for 3 months vs [facility]. No ADEs. PHQ9 at every visit. RTC 4 weeks to reasses.      Habits & Providers  Alcohol-Tobacco-Diet     Tobacco Status: never  Exercise-Depression-Behavior     Have you felt down or hopeless? yes     Have you felt little pleasure in things? yes     Depression Counseling: not indicated; screening negative for depression     Seat Belt Use: always  Allergies: 1)  ! Codeine 2)  ! Hydrocodone 3)  ! Sulfa   Complete Medication List: 1)  Celexa 40 Mg Tabs (Citalopram hydrobromide) .... One tab by mouth two times a day 2)  [facility] Nat Fiber Laxative 100 % Powd (Psyllium) .... Use as directed three times a day 3)  Colace 100 Mg Caps (Docusate sodium) .... One tab by mouth two times a day  Other Orders: [facility]- Est Level  3 (81191) Prescriptions: CELEXA 40 MG TABS (CITALOPRAM HYDROBROMIDE) One tab by mouth two times a day  #90 x 0   Entered and Authorized by:   Rodney Langton MD   Signed by:   Rodney Langton MD  on 08/06/2010   Method used:   Electronically to        [facility]  [address]* (retail)       [address]       [place], [place]  [address]       Ph: [phone]       Fax: [phone]   RxID:   [id number]    Orders Added: 1)  [facility]- Est Level  3 [84132]

## 2010-08-13 NOTE — Letter (Signed)
Summary: Jury Duty Excuse Letter  [facility]  [address]   [place], [place] [address]   Phone: [phone]  Fax: [phone]    08/06/2010  Kelly Espinoza [address] [place], [place]  [address]  To whom it may concern,  [name] is a patient of mine and has been so for over 4 years.  She is currently suffering from a medical condition that makes her unfit for jury duty.  Forced participation in jury duty would in fact worsen her medical condition.  Please excuse her from any duties.  If you have any questions, please feel free to contact my office.       Sincerely,   Rodney Langton MD

## 2010-08-27 ENCOUNTER — Encounter: Payer: Self-pay | Admitting: *Deleted

## 2010-09-17 LAB — BASIC METABOLIC PANEL
Calcium: 8.9 mg/dL (ref 8.4–10.5)
Chloride: 108 mEq/L (ref 96–112)
GFR calc Af Amer: >60 mL/min (ref >60)
GFR calc non Af Amer: >60 mL/min (ref >60)

## 2010-09-17 LAB — DIFFERENTIAL
Basophils Absolute: 0 10*3/uL (ref 0.0–0.1)
Basophils Relative: 1 % (ref 0–1)
Eosinophils Relative: 2 % (ref 0–5)
Lymphs Abs: 2.4 10*3/uL (ref 0.7–4.0)
Monocytes Absolute: 0.5 10*3/uL (ref 0.1–1.0)
Neutro Abs: 4.6 10*3/uL (ref 1.7–7.7)
Neutrophils Relative %: 61 % (ref 43–77)

## 2010-09-17 LAB — URINE MICROSCOPIC-ADD ON

## 2010-09-17 LAB — CBC
HCT: 33.4 % — ABNORMAL LOW (ref 36.0–46.0)
Hemoglobin: 11.7 g/dL — ABNORMAL LOW (ref 12.0–15.0)
MCH: 32.4 pg (ref 26.0–34.0)
MCHC: 34.9 g/dL (ref 30.0–36.0)
MCV: 92.8 fL (ref 78.0–100.0)
Platelets: 226 10*3/uL (ref 150–400)
RBC: 3.6 MIL/uL — ABNORMAL LOW (ref 3.87–5.11)
RDW: 12.1 % (ref 11.5–15.5)

## 2010-09-17 LAB — URINALYSIS, ROUTINE W REFLEX MICROSCOPIC
Bilirubin Urine: NEGATIVE
Nitrite: NEGATIVE

## 2010-09-19 LAB — COMPREHENSIVE METABOLIC PANEL WITH GFR
ALT: 18 U/L (ref 0–35)
Albumin: 4.2 g/dL (ref 3.5–5.2)
CO2: 27 meq/L (ref 19–32)
Calcium: 9.5 mg/dL (ref 8.4–10.5)
Creatinine, Ser: 0.58 mg/dL (ref 0.4–1.2)
GFR calc non Af Amer: >60 mL/min (ref >60)
Glucose, Bld: 91 mg/dL (ref 70–99)
Potassium: 3.7 meq/L (ref 3.5–5.1)
Sodium: 139 meq/L (ref 135–145)
Total Bilirubin: 0.9 mg/dL (ref 0.3–1.2)
Total Protein: 7.6 g/dL (ref 6.0–8.3)

## 2010-09-19 LAB — CBC
HCT: 37.8 % (ref 36.0–46.0)
Hemoglobin: 13.2 g/dL (ref 12.0–15.0)
MCH: 32 pg (ref 26.0–34.0)
MCHC: 34.9 g/dL (ref 30.0–36.0)
MCV: 91.5 fL (ref 78.0–100.0)
Platelets: 203 10*3/uL (ref 150–400)
RBC: 4.13 MIL/uL (ref 3.87–5.11)
RDW: 12.3 % (ref 11.5–15.5)
WBC: 6 K/uL (ref 4.0–10.5)

## 2010-09-19 LAB — COMPREHENSIVE METABOLIC PANEL
AST: 27 U/L (ref 0–37)
Alkaline Phosphatase: 66 U/L (ref 39–117)
BUN: 10 mg/dL (ref 6–23)
Chloride: 106 mEq/L (ref 96–112)
GFR calc Af Amer: >60 mL/min (ref >60)

## 2010-09-19 LAB — LIPID PANEL
Cholesterol: 170 mg/dL (ref 0–200)
HDL: 41 mg/dL (ref >39)
LDL Cholesterol: 99 mg/dL (ref 0–99)
Total CHOL/HDL Ratio: 4.1 ratio
Triglycerides: 152 mg/dL — ABNORMAL HIGH (ref <150)
VLDL: 30 mg/dL (ref 0–40)

## 2010-09-19 LAB — CARDIAC PANEL(CRET KIN+CKTOT+MB+TROPI)
CK, MB: 0.6 ng/mL (ref 0.3–4.0)
Relative Index: INVALID (ref 0.0–2.5)
Total CK: 86 U/L (ref 7–177)
Troponin I: 0.01 ng/mL (ref 0.00–0.06)

## 2010-09-19 LAB — URINALYSIS, ROUTINE W REFLEX MICROSCOPIC
Bilirubin Urine: NEGATIVE
Glucose, UA: NEGATIVE mg/dL
Hgb urine dipstick: NEGATIVE
Ketones, ur: NEGATIVE mg/dL
Nitrite: NEGATIVE
Protein, ur: NEGATIVE mg/dL
Specific Gravity, Urine: 1.014 (ref 1.005–1.030)
Urobilinogen, UA: 1 mg/dL (ref 0.0–1.0)
pH: 7.5 (ref 5.0–8.0)

## 2010-09-19 LAB — APTT: aPTT: 33 s (ref 24–37)

## 2010-09-19 LAB — PROTIME-INR
INR: 1.01 (ref 0.00–1.49)
Prothrombin Time: 13.5 seconds (ref 11.6–15.2)

## 2010-09-19 LAB — URINE MICROSCOPIC-ADD ON

## 2010-09-19 LAB — HEMOGLOBIN A1C
Hgb A1c MFr Bld: 5.2 % (ref <5.7)
Mean Plasma Glucose: 103 mg/dL (ref <117)

## 2010-09-24 ENCOUNTER — Other Ambulatory Visit: Payer: Self-pay | Admitting: Sports Medicine

## 2010-09-24 DIAGNOSIS — R7611 Nonspecific reaction to tuberculin skin test without active tuberculosis: Secondary | ICD-10-CM | POA: Insufficient documentation

## 2010-09-24 MED ORDER — ISONIAZID 300 MG PO TABS: 300.0000 mg | ORAL_TABLET | Freq: Every day | ORAL | Status: DC

## 2010-09-24 MED ORDER — CHOLESTYRAMINE 4 G PO PACK: 2.0000 | PACK | Freq: Two times a day (BID) | ORAL | Status: DC

## 2010-09-24 MED ORDER — ERGOCALCIFEROL 1.25 MG (50000 UT) PO CAPS: 50000.0000 [IU] | ORAL_CAPSULE | ORAL | Status: AC

## 2010-09-24 MED ORDER — INSULIN GLARGINE 100 UNIT/ML ~~LOC~~ SOLN: 30.0000 [IU] | Freq: Every day | SUBCUTANEOUS | Status: DC

## 2010-09-24 MED ORDER — B-6 50 MG PO TABS: ORAL_TABLET | ORAL | Status: DC

## 2010-09-24 MED ORDER — INSULIN ASPART 100 UNIT/ML ~~LOC~~ SOLN: SUBCUTANEOUS | Status: DC

## 2010-09-24 NOTE — Assessment & Plan Note (Signed)
Isoniazid. B6

## 2010-10-15 LAB — RAPID URINE DRUG SCREEN, HOSP PERFORMED
Amphetamines: NOT DETECTED
Benzodiazepines: NOT DETECTED
Cocaine: NOT DETECTED
Opiates: NOT DETECTED
Tetrahydrocannabinol: NOT DETECTED

## 2010-10-15 LAB — POCT I-STAT, CHEM 8
BUN: 14 mg/dL (ref 6–23)
Chloride: 108 mEq/L (ref 96–112)
Creatinine, Ser: 0.7 mg/dL (ref 0.4–1.2)
Potassium: 3.8 mEq/L (ref 3.5–5.1)
Sodium: 142 mEq/L (ref 135–145)

## 2010-10-16 ENCOUNTER — Ambulatory Visit (INDEPENDENT_AMBULATORY_CARE_PROVIDER_SITE_OTHER): Payer: BC Managed Care – PPO | Admitting: Family Medicine

## 2010-10-16 ENCOUNTER — Encounter: Payer: Self-pay | Admitting: Family Medicine

## 2010-10-16 VITALS — BP 108/72 | HR 76 | Temp 97.6°F | Ht 65.6 in | Wt 132.0 lb

## 2010-10-16 DIAGNOSIS — H6692 Otitis media, unspecified, left ear: Secondary | ICD-10-CM | POA: Insufficient documentation

## 2010-10-16 DIAGNOSIS — H6690 Otitis media, unspecified, unspecified ear: Secondary | ICD-10-CM

## 2010-10-16 DIAGNOSIS — N390 Urinary tract infection, site not specified: Secondary | ICD-10-CM

## 2010-10-16 DIAGNOSIS — R300 Dysuria: Secondary | ICD-10-CM

## 2010-10-16 LAB — POCT URINALYSIS DIPSTICK
Blood, UA: NEGATIVE
Nitrite, UA: NEGATIVE
Urobilinogen, UA: 0.2
pH, UA: 5.5

## 2010-10-16 LAB — POCT UA - MICROSCOPIC ONLY

## 2010-10-16 MED ORDER — CIPROFLOXACIN HCL 500 MG PO TABS: 500.0000 mg | ORAL_TABLET | Freq: Two times a day (BID) | ORAL | Status: AC

## 2010-10-16 NOTE — Progress Notes (Signed)
  Subjective:    Patient ID: Kelly Espinoza, female    DOB: 11/07/1955, 55 y.o.   MRN: [id number]  HPI Left ear with fullness and "congestion" for past several weeks in the setting of allergy symptoms of rhinorrhea, sneezing, coughing.  No ear drainage or fever.  Notes some hearing loss.  Has history of multiple surgeries in this ear mastoidecomy and tympanoplasty due to infection in th 1980's.  Canalplasty and tympanoplasty in 1999 for recurrence.  Urinary symptoms:  "Feels full in my back"  In right back.  Notes urinary frequency for 1 week.  No urgency.  No dysuria.  No fever, chills, nausea, vomiting.  Notes she has had two  UTI's in the past year.  Last abx was in September.  Also has history of kidney stone s/p removal.   Review of Systemssee hpi     Objective:   Physical Exam  Constitutional: She appears well-developed and well-nourished. No distress.  HENT:  Head: Normocephalic.  Nose: Nose normal.  Mouth/Throat: Oropharynx is clear and moist. No oropharyngeal exudate.       Right TM with scarring.  Left TM erythematous with scarring and disruption of normal architecture of TM.  TM appears intact with likely effusion.  Neck: Neck supple.  Cardiovascular: Normal rate and regular rhythm.   Pulmonary/Chest: Effort normal and breath sounds normal.  Abdominal: Soft. Bowel sounds are normal. She exhibits no distension. There is no tenderness. There is no rebound, no guarding and no CVA tenderness.  Lymphadenopathy:    She has no cervical adenopathy.          Assessment & Plan:

## 2010-10-16 NOTE — Assessment & Plan Note (Signed)
History of multiple ear surgeries but no recent infections.  I have chosen cipro due to need for concurrent uti treatment and sulfa allergy.

## 2010-10-16 NOTE — Assessment & Plan Note (Signed)
Main symptom of urinary frequency with no evidence of nephrolithiasis or pyleonephritis.  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  No risk factors for immunocompromise except DM.  Would not expect MRSA UTI.  Will treat with cipro and await urine culture

## 2010-10-16 NOTE — Patient Instructions (Signed)
Ciprofloxacin should terat both ear infection and urinary tract infection We sent your urine for culture to make sure the antibiotic chosen is effective, if any change needs to be made, we will call you. Follow-up if no improvement in 48-72 hours.

## 2010-10-18 LAB — URINE CULTURE

## 2010-11-07 ENCOUNTER — Encounter: Payer: Self-pay | Admitting: Sports Medicine

## 2010-11-07 ENCOUNTER — Ambulatory Visit (INDEPENDENT_AMBULATORY_CARE_PROVIDER_SITE_OTHER): Payer: BC Managed Care – PPO | Admitting: Sports Medicine

## 2010-11-07 DIAGNOSIS — M722 Plantar fascial fibromatosis: Secondary | ICD-10-CM

## 2010-11-07 DIAGNOSIS — F3289 Other specified depressive episodes: Secondary | ICD-10-CM

## 2010-11-07 DIAGNOSIS — F329 Major depressive disorder, single episode, unspecified: Secondary | ICD-10-CM

## 2010-11-07 DIAGNOSIS — Z23 Encounter for immunization: Secondary | ICD-10-CM

## 2010-11-07 MED ORDER — TETANUS-DIPHTH-ACELL PERTUSSIS 5-2.5-18.5 LF-MCG/0.5 IM SUSP
0.5000 mL | Freq: Once | INTRAMUSCULAR | Status: AC
  Administered 2010-11-07: 0.5 mL via INTRAMUSCULAR

## 2010-11-07 MED ORDER — ACETAMINOPHEN ER 650 MG PO TBCR: 1300.0000 mg | EXTENDED_RELEASE_TABLET | Freq: Two times a day (BID) | ORAL | Status: AC | PRN

## 2010-11-07 MED ORDER — CITALOPRAM HYDROBROMIDE 40 MG PO TABS: 40.0000 mg | ORAL_TABLET | Freq: Every day | ORAL | Status: DC

## 2010-11-07 NOTE — Assessment & Plan Note (Signed)
Doing fantastic, pt feels excellent. Refilling celexa.

## 2010-11-07 NOTE — Progress Notes (Signed)
  Subjective:    Patient ID: Kelly Espinoza, female    DOB: [birth year], 55 y.o.   MRN: [id number]  HPI L heel pain, bottom of heel since yesterday morning.  Woke up with the pain.  Had been having some minimal pain in L hip at THA site, favoring the L leg, then developed the pain.  Worse in the morning for the first few steps.  No injury.  Review of Systems      See HPI Objective:   Physical Exam  Constitutional: She appears well-developed and well-nourished. No distress.  Skin: Skin is warm and dry.  L Ankle: No visible erythema or swelling. Range of motion is full in all directions. Strength is 5/5 in all directions. Stable lateral and medial ligaments; squeeze test and kleiger test unremarkable; Talar dome nontender; No pain at base of 5th MT; No tenderness over cuboid; No tenderness over N spot or navicular prominence No tenderness on posterior aspects of lateral and medial malleolus No sign of peroneal tendon subluxations or tenderness to palpation Negative tarsal tunnel tinel's TTP plantar aspect of calcaneus. L leg approx 0.5 cm shorter than R  Gait examined, turns in L foot, minimally antalgic.      Assessment & Plan:

## 2010-11-07 NOTE — Patient Instructions (Signed)
Great to see you, See rehab exercises below. Go get a heel cup from your pharmacy and wear on left foot.    Kelly Espinoza. Kelly Espinoza, M.D.   Plantar Fasciitis (Heel Spur Syndrome) with Rehab   The plantar fascia is a fibrous, ligament-like, soft-tissue structure that spans the bottom of the foot. Plantar fasciitis is a condition that causes pain in the foot due to inflammation of the tissue.  SYMPTOMS  Pain and tenderness on the underneath side of the foot.  Pain that worsens with standing or walking.   CAUSES Plantar fasciitis is caused by irritation and injury to the plantar fascia on the underneath side of the foot. Common mechanisms of injury include:  Direct trauma to bottom of the foot.  Damage to a small nerve that runs under the foot where the main fascia attaches to the heel bone.  Stress placed on the plantar fascia due to bone spurs.   RISK INCREASES WITH:    Activities that place stress on the plantar fascia (running, jumping, pivoting, or cutting).  Poor strength and flexibility.  Improperly fitted shoes.  Tight calf muscles.  Flat feet.  Failure to warm-up properly before activity.  Obesity.   PREVENTIVE MEASURES    Warm up and stretch properly before activity.  Allow for adequate recovery between workouts.  Maintain physical fitness: l Strength, flexibility, and endurance. l Cardiovascular fitness.  Maintain a health body weight.  Avoid stress on the plantar fascia.  Wear properly fitted shoes, including arch supports for individuals who have flat feet.   PROGNOSIS If treated properly, then the symptoms of plantar fasciitis usually resolve without surgery.  However, occasionally surgery is necessary.   POSSIBLE COMPLICATIONS    Recurrent symptoms that may result in a chronic condition.  Problems of the lower back that are caused by compensating for the injury, such as limping.  Pain or weakness of the foot during push-off following  surgery.  Chronic inflammation, scarring, and partial or complete fascia tear, occurring more often from repeated injections.   GENERAL TREATMENT CONSIDERATIONS   Treatment initially involves the use of ice and medication to help reduce pain and inflammation. The use of strengthening and stretching exercises may help reduce pain with activity, especially stretches of the Achilles tendon. These exercises may be performed at home or with a therapist. Your caregiver may recommend that you use heel cups of arch supports to help reduce stress on the plantar fascia. Occasionally, corticosteroid injections are given to reduce inflammation. If symptoms persist for greater than 6 months despite non-surgical  (conservative), then surgery may be recommended.     MEDICATION    If pain medication is necessary, then nonsteroidal anti-inflammatory medications, such as aspirin and ibuprofen, or other minor pain relievers, such as acetaminophen, are often recommended.    Do not take pain medication within 7 days before surgery.    Prescription pain relievers may be given if deemed necessary by your caregiver. Use only as directed and only as much as you need.  Corticosteroid injections may be given by your caregiver. These injections should be reserved for the most serious cases, because they may only be given a certain number of times.   HEAT AND COLD    Cold treatment (icing) relieves pain and reduces inflammation. Cold treatment should be applied for 10 to 15 minutes every 2 to 3 hours for inflammation and pain and immediately after any activity that aggravates your symptoms. Use ice packs or massage the area with a  piece of ice (ice massage).  Heat treatment may be used prior to performing the stretching and strengthening activities prescribed by your caregiver, physical therapist, or athletic trainer. Use a heat pack or soak the injury in warm water.   SEEK TREATMENT IF:  Treatment seems to offer no  benefit, or the condition worsens.  Any medications produce adverse side effects.   EXERCISES   RANGE OF MOTION AND STRETCHING EXERCISES - Plantar Fasciitis (Heel Spur Syndrome) These exercises may help you when beginning to rehabilitate your injury. Your symptoms may resolve with or without further involvement from your physician, physical therapist or athletic trainer. While completing these exercises, remember:      Restoring tissue flexibility helps normal motion to return to the joints. This allows healthier, less painful movement and activity.  An effective stretch should be held for at least 30 seconds.  A stretch should never be painful. You should only feel a gentle lengthening or release in the stretched tissue.      RANGE OF MOTION - Toe Extension, Flexion  Sit with your __________ leg crossed over your opposite knee.  Grasp your toes and gently pull them back toward the top of your foot. You should feel a stretch on the bottom of your toes and/or foot.    Hold this stretch for _15__ seconds.    Now, gently pull your toes toward the bottom of your foot. You should feel a stretch on the top of your toes and or foot.  Hold this stretch for _15__ seconds.   Repeat _3__ times. Complete this stretch __3_ times per day.       RANGE OF MOTION - Ankle Dorsiflexion, Active Assisted   Remove shoes and sit on a chair that is preferably not on a carpeted surface.    Place __________ foot under knee. Extend your opposite leg for support.  Keeping your heel down, slide your __________ foot back toward the chair until you feel a stretch at your ankle or calf. If you do not feel a stretch, slide your bottom forward to the edge of the chair, while still keeping your heel down.  Hold this stretch for __15_ seconds.   Repeat _3___ times. Complete this stretch _3__ times per day.      STRETCH - Gastroc, Standing   Place hands on wall.  Extend __________ leg, keeping the front  knee somewhat bent.  Slightly point your toes inward on your back foot.  Keeping your __________ heel on the floor and your knee straight, shift your weight toward the wall, not allowing your back to arch.  You should feel a gentle stretch in the __________ calf. Hold this stretch for __15_ seconds.   Repeat _3___ times. Complete this stretch _3__ times per day.     STRETCH - Soleus, Standing   Place hands on wall.  Extend __________ leg, keeping the other knee somewhat bent.  Slightly point your toes inward on your back foot.  Keep your __________ heel on the floor, bend your back knee, and slightly shift your weight over the back leg so that you feel a gentle stretch deep in your back calf.    Hold this stretch for __15_ seconds.   Repeat _3___ times. Complete this stretch _3__ times per day.      STRETCH - Gastrocsoleus, Standing  Note: This exercise can place a lot of stress on your foot and ankle. Please complete this exercise only if specifically instructed by your caregiver.  Place the ball of your __________ foot on a step, keeping your other foot firmly on the same step.    Hold on to the wall or a rail for balance.  Slowly lift your other foot, allowing your body weight to press your heel down over the edge of the step.  You should feel a stretch in your __________ calf.  Hold this stretch for __15_ seconds.   Repeat _3___ times. Complete this stretch _3__ times per day.      STRENGTHENING EXERCISES - Plantar Fasciitis (Heel Spur Syndrome)  These exercises may help you when beginning to rehabilitate your injury. They may resolve your symptoms with or without further involvement from your physician, physical therapist or athletic trainer. While completing these exercises, remember:    Muscles can gain both the endurance and the strength needed for everyday activities through controlled exercises.  Complete these exercises as instructed by your physician, physical  therapist or athletic trainer.  Progress the resistance and repetitions only as guided.    STRENGTH - Towel Curls  Sit in a chair positioned on a non-carpeted surface.    Place your foot on a towel, keeping your heel on the floor.  Pull the towel toward your heel by only curling your toes. Keep your heel on the floor.  If instructed by your physician, physical therapist or athletic trainer, add ____________________ at the end of the towel. Repeat __10_ times. Complete this exercise __3__ times per day.     STRENGTH - Ankle Inversion   Secure one end of a rubber exercise band/tubing to a fixed object (table, pole). Loop the other end around your foot just before your toes.  Place your fists between your knees. This will focus your strengthening at your ankle.  Slowly, pull your big toe up and in, making sure the band/tubing is positioned to resist the entire motion.  Hold this position for _15_ seconds.    Have your muscles resist the band/tubing as it slowly pulls your foot back to the starting position.   Repeat _3 times. Complete this exercises 3_ times per day.     Document Released: 06/22/2005  Document Re-Released: 07/14/2009 [facility] Patient Information 2011 [place], [place].

## 2010-11-07 NOTE — Assessment & Plan Note (Signed)
Heel cup. Acetaminophen 650. Rehab exercises. RTC prn.

## 2010-11-07 NOTE — Progress Notes (Signed)
Addended byJimmy Footman on: 11/07/2010 09:46 AM   Modules accepted: Orders
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
## 2010-12-26 ENCOUNTER — Encounter: Payer: Self-pay | Admitting: Sports Medicine

## 2010-12-26 ENCOUNTER — Ambulatory Visit (INDEPENDENT_AMBULATORY_CARE_PROVIDER_SITE_OTHER): Payer: BC Managed Care – PPO | Admitting: Sports Medicine

## 2010-12-26 VITALS — BP 100/72 | HR 64 | Temp 97.8°F | Ht 65.5 in | Wt 128.0 lb

## 2010-12-26 DIAGNOSIS — H6592 Unspecified nonsuppurative otitis media, left ear: Secondary | ICD-10-CM | POA: Insufficient documentation

## 2010-12-26 DIAGNOSIS — H6590 Unspecified nonsuppurative otitis media, unspecified ear: Secondary | ICD-10-CM

## 2010-12-26 MED ORDER — AZITHROMYCIN 250 MG PO TABS: ORAL_TABLET | ORAL | Status: AC

## 2010-12-26 NOTE — Progress Notes (Signed)
  Subjective:    Patient ID: Kelly Espinoza, female    DOB: 1955-12-17, 55 y.o.   MRN: [id number]  HPI URI Symptoms Onset: 1 wk Description: flu-like symptoms, cough, feels tired like knocked off her feet, ST, rhinorrhea, L ear pain (feels full), some belly pain, some diarrhea.    Social Hx:  Non-smoker.  Symptoms Nasal discharge: YES Fever: Subjective Sore throat: YES Cough: YES Wheezing: NO Ear pain: YES GI symptoms: YES Sick contacts: Husband.  Red Flags  Stiff neck: NO Dyspnea: NO Rash: NO Swallowing difficulty: NO  Sinusitis Risk Factors Headache/face pain: NO Double sickening: NO tooth pain: NO  Allergy Risk Factors Sneezing: NO Itchy scratchy throat: NO Seasonal symptoms: NO  Flu Risk Factors Headache: NO muscle aches: NO severe fatigue: YES Review of Systems See HPI    Objective:   Physical Exam  Constitutional: She appears well-developed and well-nourished. No distress.  HENT:  Head: Normocephalic and atraumatic.  Right Ear: External ear normal.  Nose: Nose normal.  Mouth/Throat: Oropharynx is clear and moist. No oropharyngeal exudate.       L otitis media with effusion.  Eyes: Conjunctivae and EOM are normal. Pupils are equal, round, and reactive to light.  Neck: Normal range of motion. Neck supple. No JVD present. No tracheal deviation present. No thyromegaly present.  Cardiovascular: Normal rate, regular rhythm and normal heart sounds.  Exam reveals no gallop and no friction rub.   No murmur heard. Pulmonary/Chest: Effort normal. No stridor. No respiratory distress. She has no wheezes. She has no rales. She exhibits no tenderness.  Abdominal: Soft. She exhibits no distension. There is no tenderness. There is no rebound and no guarding.  Musculoskeletal: She exhibits no edema.  Lymphadenopathy:    She has cervical adenopathy.  Skin: Skin is warm and dry.      Assessment & Plan:

## 2010-12-26 NOTE — Patient Instructions (Addendum)
Great to see you. Take the azithromycin as needed. Use TheraFlu day/nighttime packets every 4-6h for your symptoms. Come back to see [place] if no better in 2 weeks.    Ihor Austin. Benjamin Stain, M.D. [facility] [address], [place] [address] [phone] Middle Ear Infection, Adult (Otitis Media, Adult) A middle ear infection is an infection in the space behind the eardrum. The medical name for this is "otitis media." It may happen after a common cold. It is caused by a germ that starts growing in that space. You may feel swollen glands in your neck on the side of the ear infection. HOME CARE INSTRUCTIONS  Take your medicine as directed until it is gone, even if you feel better after the first few days.   Only take over-the-counter or prescription medicines for pain, discomfort, or fever as directed by your caregiver.   Occasional use of a nasal decongestant a couple times per day may help with discomfort and help the eustachian tube to drain better.  Follow up with your caregiver in 10 to 14 days or as directed, to be certain that the infection has cleared. Not keeping the appointment could result in a chronic or permanent injury, pain, hearing loss and disability. If there is any problem keeping the appointment, you must call back to this facility for assistance. SEEK IMMEDIATE MEDICAL CARE IF:  You are not getting better in 2 to 3 days.   You have pain that is not controlled with medication.   You feel worse instead of better.   You cannot use the medication as directed.   You develop swelling, redness or pain around the ear or stiffness in your neck.  MAKE SURE YOU:  Understand these instructions.   Will watch your condition.   Will get help right away if you are not doing well or get worse.  Document Released: 03/27/2004 Document Re-Released: 12/10/2009 [facility] Patient Information 2011 [place], [place].

## 2010-12-26 NOTE — Assessment & Plan Note (Signed)
Though no wheeze, I suspect concurrent bronchitis with coughing. Will rx azithromycin 500mg  one day 1, then 250 for 4 days for both AOM coverage and bronchitis tx. Theraflu as needed.

## 2011-03-16 ENCOUNTER — Encounter: Payer: Self-pay | Admitting: Family Medicine

## 2011-03-16 ENCOUNTER — Ambulatory Visit (INDEPENDENT_AMBULATORY_CARE_PROVIDER_SITE_OTHER): Payer: BC Managed Care – PPO | Admitting: Family Medicine

## 2011-03-16 VITALS — BP 109/71 | HR 82 | Temp 97.7°F | Wt 136.0 lb

## 2011-03-16 DIAGNOSIS — N390 Urinary tract infection, site not specified: Secondary | ICD-10-CM | POA: Insufficient documentation

## 2011-03-16 DIAGNOSIS — R109 Unspecified abdominal pain: Secondary | ICD-10-CM | POA: Insufficient documentation

## 2011-03-16 DIAGNOSIS — H6590 Unspecified nonsuppurative otitis media, unspecified ear: Secondary | ICD-10-CM

## 2011-03-16 DIAGNOSIS — H6592 Unspecified nonsuppurative otitis media, left ear: Secondary | ICD-10-CM

## 2011-03-16 DIAGNOSIS — R300 Dysuria: Secondary | ICD-10-CM

## 2011-03-16 DIAGNOSIS — F329 Major depressive disorder, single episode, unspecified: Secondary | ICD-10-CM

## 2011-03-16 LAB — COMPREHENSIVE METABOLIC PANEL
ALT: 18 U/L (ref 0–35)
BUN: 18 mg/dL (ref 6–23)
CO2: 29 mEq/L (ref 19–32)
Calcium: 9.7 mg/dL (ref 8.4–10.5)
Creat: 0.6 mg/dL (ref 0.50–1.10)
Total Bilirubin: 0.3 mg/dL (ref 0.3–1.2)

## 2011-03-16 LAB — POCT URINALYSIS DIPSTICK
Ketones, UA: NEGATIVE
Nitrite, UA: NEGATIVE
Protein, UA: NEGATIVE
pH, UA: 5.5

## 2011-03-16 LAB — CBC
HCT: 40.8 % (ref 36.0–46.0)
Hemoglobin: 13 g/dL (ref 12.0–15.0)
MCV: 95.3 fL (ref 78.0–100.0)
RBC: 4.28 MIL/uL (ref 3.87–5.11)
RDW: 12.6 % (ref 11.5–15.5)
WBC: 5.5 10*3/uL (ref 4.0–10.5)

## 2011-03-16 MED ORDER — CEPHALEXIN 500 MG PO CAPS: 500.0000 mg | ORAL_CAPSULE | Freq: Three times a day (TID) | ORAL | Status: AC

## 2011-03-16 NOTE — Assessment & Plan Note (Signed)
Counseled to not miss doses of SSRI this could cause the light-headed ness, she is also very depressed.

## 2011-03-16 NOTE — Patient Instructions (Signed)
Keflex 500 mg three times a day for a week 1-2 liters of free water a day to rehydrate and clear urine/cranberry juice Take your citalopram,do not miss does Vitamin D 1000 mg daily Calcium with D, 500-600 and take one a day MVI with B complex

## 2011-03-16 NOTE — Assessment & Plan Note (Signed)
Keflex for one week and a lot of fluids

## 2011-03-16 NOTE — Progress Notes (Signed)
  Subjective:    Patient ID: Kelly Espinoza, female    DOB: 01/11/1956, 55 y.o.   MRN: [id number]  HPI One week of feeling light headed that lasts a few minutes.  Last night with lower abdominal pain and dark urine.  Worried that she has a UTI.  Has some dysuria, no frequency.  Left ear bothering her, review shows a chronic problem-effusion  Very depressed, two children with severe autism, daughter recently was granted guardianship to the group home, she is upset about this.  She has missed her citalopram dose often lately, but wants to stay on it.  Medications were corrected, as there were some incorrect meds listed as insulin and treatment for TB, she has not every had either of these, this is likely a problem with EMR.  This was corrected.   Review of Systems  Constitutional: Negative for fever.  Gastrointestinal: Positive for abdominal pain. Negative for nausea and vomiting.  Genitourinary: Positive for dysuria and flank pain. Negative for frequency.  Neurological: Positive for light-headedness. Negative for syncope.       Objective:   Physical Exam  Constitutional: She is oriented to person, place, and time. She appears well-developed and well-nourished.  HENT:  Mouth/Throat: Oropharynx is clear and moist.       Right TM with areas of scaring, left TM with injection and effusion  Cardiovascular: Normal rate, regular rhythm and normal heart sounds.   Pulmonary/Chest: Effort normal and breath sounds normal.  Abdominal: Soft. Bowel sounds are normal. She exhibits no distension. There is no tenderness.  Genitourinary:       Urine concentrated, + blood, + leuks  Neurological: She is alert and oriented to person, place, and time. No cranial nerve deficit.       A little unsteady when standing (BP dropped to 100/60)          Assessment & Plan:

## 2011-03-16 NOTE — Assessment & Plan Note (Signed)
Chronic, Keflex may cover any secondary infection

## 2011-03-17 ENCOUNTER — Encounter: Payer: Self-pay | Admitting: Family Medicine

## 2011-03-18 LAB — URINE CULTURE: Colony Count: 80000

## 2011-04-10 ENCOUNTER — Ambulatory Visit (INDEPENDENT_AMBULATORY_CARE_PROVIDER_SITE_OTHER): Payer: BC Managed Care – PPO | Admitting: Family Medicine

## 2011-04-10 VITALS — BP 102/63 | HR 72 | Temp 98.0°F | Wt 139.7 lb

## 2011-04-10 DIAGNOSIS — T148XXA Other injury of unspecified body region, initial encounter: Secondary | ICD-10-CM

## 2011-04-10 DIAGNOSIS — T148 Other injury of unspecified body region: Secondary | ICD-10-CM

## 2011-04-10 DIAGNOSIS — W57XXXA Bitten or stung by nonvenomous insect and other nonvenomous arthropods, initial encounter: Secondary | ICD-10-CM | POA: Insufficient documentation

## 2011-04-10 MED ORDER — TRIAMCINOLONE ACETONIDE 0.1 % EX OINT: TOPICAL_OINTMENT | Freq: Two times a day (BID) | CUTANEOUS | Status: DC

## 2011-04-10 NOTE — Patient Instructions (Signed)
Thank you for coming in today. Apply the ointment to the itchy red bump twice a day until the skin start to look normal again.  That means not raised and the texture is normal.  Don't use this ointment on your face with out a doctor's OK.

## 2011-04-10 NOTE — Progress Notes (Signed)
Lesion on left forearm. Started as a bug bite one week ago. Initially described as a red papule developed a vesicle. It is intensely itchy. She's been scratching, and the redness has extended to the size of a centimeter in diameter.  She's been trying Neosporin and witch hazel but that has not helped much. No fevers or chills no other lesions feels well otherwise.  PMH reviewed.  ROS as above otherwise neg Medications reviewed.   Exam:  BP 102/63  Pulse 72  Temp(Src) 98 F (36.7 C) (Oral)  Wt 139 lb 11.2 oz (63.368 kg) Gen: Well NAD SKIN: 1 cm diameter raised erythematous plaque on left forearm. No scale

## 2011-04-10 NOTE — Assessment & Plan Note (Signed)
Appears to be a bug bite. Prescribe triamcinolone ointment applied twice a day until the skin looks normal. Patient expresses understanding

## 2011-05-21 ENCOUNTER — Encounter: Payer: Self-pay | Admitting: Family Medicine

## 2011-05-21 ENCOUNTER — Ambulatory Visit (INDEPENDENT_AMBULATORY_CARE_PROVIDER_SITE_OTHER): Payer: BC Managed Care – PPO | Admitting: Family Medicine

## 2011-05-21 VITALS — BP 99/62 | HR 69 | Temp 98.2°F | Ht 65.5 in | Wt 141.5 lb

## 2011-05-21 DIAGNOSIS — R52 Pain, unspecified: Secondary | ICD-10-CM | POA: Insufficient documentation

## 2011-05-21 MED ORDER — MELOXICAM 15 MG PO TABS: 15.0000 mg | ORAL_TABLET | Freq: Every day | ORAL | Status: DC

## 2011-05-21 NOTE — Assessment & Plan Note (Signed)
Acute on chronic pain not associated with any viral illness or signs of inflammatory arthropathy. Likely multifactorial due to deconditioning, depression, plantar fasciitis.  I have prescribed meloxicam and recommend that she start a gentle exercise program. She has identified swimming at the [facility] is something that she would like to start. I have recommended that she followup with her PCP.  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

## 2011-05-21 NOTE — Patient Instructions (Addendum)
Follow-up with your PCP  I recommend daily exercise to help with your pain.  Consider swimming

## 2011-05-21 NOTE — Progress Notes (Signed)
  Subjective:    Patient ID: Kelly Espinoza, female    DOB: Jun 26, 1956, 55 y.o.   MRN: [id number]  HPI 55 year old here for a same-day appointment to evaluate" pain all over"  She has a past medical history significant for left plantar fasciitis, left hip replacement in 2008, depression.  Patient states she has pain in bilateral hips, low back which causes aching in her muscles and feet. She states she has no knee pain. She also notes she has hand pain as well.  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 She does not currently do any physical activity other than this work.   Review of Systems no fever, chills, radiculopathy, numbness, tingling, weakness, change in bowel or bladder habits.     Objective:   Physical Exam GEN: Alert & Oriented, No acute distress, anxious appearing, here with her husband. CV:  Regular Rate & Rhythm, no murmur Respiratory:  Normal work of breathing, CTAB Abd:  + BS, soft, no tenderness to palpation Ext: no pre-tibial edema Back: no significant tenderness to palpation.  negatives straight leg rasie.  Left hip less flexible than right.  Patellar reflex 2+ bilaterally, ambulation wnl.  No synovitis of hands.        Assessment & Plan:

## 2011-06-19 ENCOUNTER — Ambulatory Visit (INDEPENDENT_AMBULATORY_CARE_PROVIDER_SITE_OTHER): Payer: BC Managed Care – PPO | Admitting: Family Medicine

## 2011-06-19 ENCOUNTER — Encounter: Payer: Self-pay | Admitting: Family Medicine

## 2011-06-19 VITALS — BP 108/69 | HR 84 | Temp 98.6°F | Ht 65.6 in | Wt 142.0 lb

## 2011-06-19 DIAGNOSIS — Z1231 Encounter for screening mammogram for malignant neoplasm of breast: Secondary | ICD-10-CM

## 2011-06-19 DIAGNOSIS — Z0000 Encounter for general adult medical examination without abnormal findings: Secondary | ICD-10-CM

## 2011-06-19 MED ORDER — IBUPROFEN 800 MG PO TABS: 800.0000 mg | ORAL_TABLET | Freq: Three times a day (TID) | ORAL | Status: AC | PRN

## 2011-06-19 NOTE — Assessment & Plan Note (Signed)
Discussed mammography, and colonoscopy the patient. We will place this ordered today. She is to call and make another appointment for Pap smear today.

## 2011-06-19 NOTE — Progress Notes (Signed)
  Subjective:    Patient ID: Kelly Espinoza, female    DOB: 1956/04/17, 55 y.o.   MRN: [id number]  HPI Patient here for complete physical exam. I reviewed her past medical history, social history, family history with her. She is a nonsmoker.  No current complaints.  Depression is under control.  Has been exercising more and suffering from less body aches.     Review of Systems The patient denies fever, unusual weight change, decreased hearing, chest pain, palpitations, pre-syncopal or syncopal episodes, dyspnea on exertion, prolonged cough, hemoptysis, change in bowel habits, melena, hematochezia, severe indigestion/heartburn, nausea/vomiting/abdominal pain, genital sores, muscle weakness, difficulty walking, abnormal bleeding, or enlarged lymph nodes.       Objective:   Physical Exam  Gen:  Alert, cooperative patient who appears stated age in no acute distress.  Vital signs reviewed. HEENT:  [place]/AT.  EOMI, PERRL.  MMM, tonsils non-erythematous, non-edematous.  External ears WNL, Bilateral TM's normal without retraction, redness or bulging.  Neck: No masses or thyromegaly or limitation in range of motion.  No cervical lymphadenopathy. Pulm:  Clear to auscultation bilaterally with good air movement.  No wheezes or rales noted.   Cardiac:  Regular rate and rhythm without murmur auscultated.  Good S1/S2. Abd:  Soft/nondistended/nontender.  Good bowel sounds throughout all four quadrants.  No masses noted.  Ext:  No clubbing/cyanosis/erythema.  No edema noted bilateral lower extremities.   Neuro: Alert and oriented to person, place, and date.  CN II-XII intact.  No focal deficits noted.         Assessment & Plan:

## 2011-06-23 NOTE — Progress Notes (Signed)
Addended by: Damita Lack on: 06/23/2011 03:25 PM   Modules accepted: Orders

## 2011-08-05 ENCOUNTER — Ambulatory Visit
Admission: RE | Admit: 2011-08-05 | Discharge: 2011-08-05 | Disposition: A | Discharge status: Home / Self Care | Payer: BC Managed Care – PPO | Source: Ambulatory Visit | Attending: Family Medicine | Admitting: Family Medicine

## 2011-08-05 DIAGNOSIS — Z1231 Encounter for screening mammogram for malignant neoplasm of breast: Secondary | ICD-10-CM

## 2011-08-07 ENCOUNTER — Telehealth: Payer: Self-pay | Admitting: Family Medicine

## 2011-08-07 NOTE — Telephone Encounter (Signed)
Called and left message for patient on her home answering machine.  Need to discuss mammogram report with her

## 2011-08-07 NOTE — Telephone Encounter (Signed)
Message copied by Cuero Cellar on Fri Aug 07, 2011 10:07 AM ------      Message from: Tivis Ringer      Created: Thu Aug 06, 2011 10:19 AM       Note possible mass in Rt. breast      ----- Message -----         From: Rad Results In Interface         Sent: 08/06/2011  10:10 AM           To: Sanjuana Letters, MD

## 2011-08-11 ENCOUNTER — Other Ambulatory Visit: Payer: Self-pay | Admitting: Family Medicine

## 2011-08-11 ENCOUNTER — Ambulatory Visit
Admission: RE | Admit: 2011-08-11 | Discharge: 2011-08-11 | Disposition: A | Discharge status: Home / Self Care | Payer: BC Managed Care – PPO | Source: Ambulatory Visit | Attending: Family Medicine | Admitting: Family Medicine

## 2011-08-11 DIAGNOSIS — R928 Other abnormal and inconclusive findings on diagnostic imaging of breast: Secondary | ICD-10-CM

## 2011-08-11 NOTE — Telephone Encounter (Signed)
Called and discussed options on Thursday 1/31 with patient (**late note).  Will check back to make sure breast center has contacted her later this week.  If no contact from [facility] I will call them.  I have left myself a note to do this.

## 2011-10-21 ENCOUNTER — Other Ambulatory Visit (HOSPITAL_COMMUNITY): Payer: Self-pay | Admitting: Orthopedic Surgery

## 2011-10-21 DIAGNOSIS — M25559 Pain in unspecified hip: Secondary | ICD-10-CM

## 2011-10-26 ENCOUNTER — Ambulatory Visit (HOSPITAL_COMMUNITY)
Admission: RE | Admit: 2011-10-26 | Discharge: 2011-10-26 | Disposition: A | Discharge status: Home / Self Care | Payer: BC Managed Care – PPO | Source: Ambulatory Visit | Attending: Orthopedic Surgery | Admitting: Orthopedic Surgery

## 2011-10-26 DIAGNOSIS — Z96649 Presence of unspecified artificial hip joint: Secondary | ICD-10-CM | POA: Insufficient documentation

## 2011-10-26 DIAGNOSIS — M25559 Pain in unspecified hip: Secondary | ICD-10-CM

## 2011-10-29 ENCOUNTER — Ambulatory Visit (INDEPENDENT_AMBULATORY_CARE_PROVIDER_SITE_OTHER): Payer: BC Managed Care – PPO | Admitting: Family Medicine

## 2011-10-29 ENCOUNTER — Encounter: Payer: Self-pay | Admitting: Family Medicine

## 2011-10-29 VITALS — BP 100/66 | HR 82 | Temp 98.4°F | Ht 65.5 in | Wt 136.0 lb

## 2011-10-29 DIAGNOSIS — R300 Dysuria: Secondary | ICD-10-CM

## 2011-10-29 DIAGNOSIS — N390 Urinary tract infection, site not specified: Secondary | ICD-10-CM

## 2011-10-29 DIAGNOSIS — H6590 Unspecified nonsuppurative otitis media, unspecified ear: Secondary | ICD-10-CM

## 2011-10-29 LAB — POCT URINALYSIS DIPSTICK
Nitrite, UA: NEGATIVE
Protein, UA: NEGATIVE
pH, UA: 5.5

## 2011-10-29 LAB — POCT UA - MICROSCOPIC ONLY: Epithelial cells, urine per micros: <5

## 2011-10-29 MED ORDER — CEFDINIR 300 MG PO CAPS: 300.0000 mg | ORAL_CAPSULE | Freq: Two times a day (BID) | ORAL | Status: DC

## 2011-10-29 MED ORDER — AMOXICILLIN 500 MG PO CAPS: 500.0000 mg | ORAL_CAPSULE | Freq: Two times a day (BID) | ORAL | Status: DC

## 2011-10-29 NOTE — Assessment & Plan Note (Addendum)
Left OM, appears somewhat chronic as opposed to acutely inflamed.  Given concurrent UTI, will treat with third gen ceph to cover both.  Has ENT follow-up in several weeks

## 2011-10-29 NOTE — Assessment & Plan Note (Signed)
Treating with cefdinir to cover both uti and otitis media with effusion.

## 2011-10-29 NOTE — Patient Instructions (Addendum)
Cefdinir for 7 days or ear and urinary tract infection It may take longer for the fluid behind your ear to resolve Keep appointment with ENT

## 2011-10-29 NOTE — Progress Notes (Signed)
  Subjective:    Patient ID: Kelly Espinoza, female    DOB: 1956/04/11, 56 y.o.   MRN: [id number]  HPIWants to know if she has an ear infection or a urinary tract infection  Left ear fullness: worse when she eats dairy.  Started a few weeks ago when she had a cold.   Notes decreased hearing- had chronically decreased hearing from past ear surgeries.  Chronic bilateral tinnitus.  Has ENT to establish new patient with Dr. Felisa Bonier @ [facility].  Urinary symptoms:  Burning/Itching with urination.  No urinary frequency or hesitation.  Can empty bladder.   No abdominal pain.  No recent antibiotics or instrumentation.    Review of Systems no fever, chills, abdominal pain, vaginal discharge     Objective:   Physical Exam  GEN: Alert & Oriented, No acute distress EAR:  Right TM scarred, intact.  Left TM opaque with suppurative effusion.  No significant erythema.  TM intact. CV:  Regular Rate & Rhythm, no murmur Respiratory:  Normal work of breathing, CTAB Abd:  + BS, soft, no tenderness to palpation Ext: no pre-tibial edema       Assessment & Plan:

## 2011-10-31 LAB — URINE CULTURE
Colony Count: NO GROWTH
Organism ID, Bacteria: NO GROWTH

## 2012-02-04 ENCOUNTER — Telehealth: Payer: Self-pay | Admitting: Family Medicine

## 2012-02-04 DIAGNOSIS — F329 Major depressive disorder, single episode, unspecified: Secondary | ICD-10-CM

## 2012-02-04 MED ORDER — CITALOPRAM HYDROBROMIDE 40 MG PO TABS: 40.0000 mg | ORAL_TABLET | Freq: Every day | ORAL | Status: DC

## 2012-02-04 NOTE — Telephone Encounter (Signed)
Routed to MD

## 2012-02-04 NOTE — Telephone Encounter (Signed)
Pt just realized she is out of the celexa and is going out of town for a week and needs enough to last until she gets back - she has an appt w/ PCP on 8/15 [facility] [place]

## 2012-02-18 ENCOUNTER — Ambulatory Visit (INDEPENDENT_AMBULATORY_CARE_PROVIDER_SITE_OTHER): Payer: BC Managed Care – PPO | Admitting: Family Medicine

## 2012-02-18 VITALS — BP 106/67 | HR 83 | Temp 97.7°F | Ht 65.5 in | Wt 137.0 lb

## 2012-02-18 DIAGNOSIS — R3589 Other polyuria: Secondary | ICD-10-CM

## 2012-02-18 DIAGNOSIS — R358 Other polyuria: Secondary | ICD-10-CM

## 2012-02-18 DIAGNOSIS — F329 Major depressive disorder, single episode, unspecified: Secondary | ICD-10-CM

## 2012-02-18 DIAGNOSIS — Z0000 Encounter for general adult medical examination without abnormal findings: Secondary | ICD-10-CM

## 2012-02-18 DIAGNOSIS — N390 Urinary tract infection, site not specified: Secondary | ICD-10-CM

## 2012-02-18 LAB — POCT URINALYSIS DIPSTICK
Bilirubin, UA: NEGATIVE
Glucose, UA: NEGATIVE
Nitrite, UA: NEGATIVE
Urobilinogen, UA: 0.2
pH, UA: 7

## 2012-02-18 LAB — GLUCOSE, CAPILLARY: Glucose-Capillary: 88 mg/dL (ref 70–99)

## 2012-02-18 MED ORDER — CITALOPRAM HYDROBROMIDE 40 MG PO TABS: 40.0000 mg | ORAL_TABLET | Freq: Every day | ORAL | Status: DC

## 2012-02-18 NOTE — Progress Notes (Signed)
Subjective:     Patient ID: Kelly Espinoza, female   DOB: October 16, 1955, 56 y.o.   MRN: [id number]  HPI Patient comes in today to get her depression medication refilled.  Depression: Patient reports her depression is under control. She is feeling much better and denies any side effects from the medications. She denies suicidal/homicidal ideations. She is starting to enjoy life more and is finding the quality of life to be better. She wishes to stay on medication for now.   Frequency of urination: She has complaints of frequency of urination. She said it was, "burning a little bit down there." She does admit she has had a history of herpes, but she does not believe it was due to an outbreak. She has had 4-5 episodes of similar complaints over the past year. There are not many positive cultures over the last year. She denies frank pain with urination or abdominal. Denies fever.   She has chronic hearing loss and tinnitus.  Review of Systems  HENT: Positive for hearing loss and tinnitus.   Respiratory: Negative for cough, chest tightness and shortness of breath.   Cardiovascular: Negative for chest pain and palpitations.  Gastrointestinal:       Polydipsia   Genitourinary: Positive for dysuria, frequency and difficulty urinating. Negative for urgency, hematuria, flank pain, genital sores and pelvic pain.  Psychiatric/Behavioral: Negative.  Negative for behavioral problems, confusion, dysphoric mood and agitation.  All other systems reviewed and are negative.        Objective:   Physical Exam  Constitutional: She is oriented to person, place, and time. She appears well-developed and well-nourished. No distress.  HENT:  Head: Normocephalic and atraumatic.  Nose: Nose normal.  Mouth/Throat: No oropharyngeal exudate.       Left ear: Scarred TM, No erythema Right ear: slightly retracted, No erythema  Eyes: Conjunctivae are normal. Right eye exhibits no discharge. Left eye exhibits no  discharge.  Neck: Normal range of motion. Neck supple. No tracheal deviation present.  Cardiovascular: Normal rate and regular rhythm.  Exam reveals friction rub. Exam reveals no gallop.   No murmur heard. Pulmonary/Chest: Effort normal and breath sounds normal. No respiratory distress. She has no wheezes. She has no rales.  Abdominal: Soft. Bowel sounds are normal. She exhibits no distension and no mass. There is no hepatosplenomegaly. There is tenderness in the right lower quadrant. There is no rigidity, no rebound, no guarding, no tenderness at McBurney's point and negative Murphy's sign.       Mild tenderness RLQ, close to midline  Lymphadenopathy:    She has no cervical adenopathy.  Neurological: She is alert and oriented to person, place, and time.  Skin: Skin is warm and dry.  Psychiatric: She has a normal mood and affect. Her behavior is normal.

## 2012-02-18 NOTE — Assessment & Plan Note (Addendum)
-   Colonoscopy referral for preventive care - Opthalmology referral - Advised to schedule PAP. - Future labs: Cholesterol panel, CMP, HgBA1c  F/U: PRN or in 5 months

## 2012-02-18 NOTE — Assessment & Plan Note (Signed)
-   Frequency of urination - Clean Catch obtained. Will send for culture. - Urology Consult for multiply episodes of UTI and UTI-like symptoms in the past year. - FU: PRN

## 2012-02-18 NOTE — Assessment & Plan Note (Addendum)
Recurrent Depression: - Patient doing well and has been on Celexa for 6 months, wishes to continue. - Refill Celexa for her today. F/U: Re-evaluate in 5 months or PRN if needed

## 2012-02-18 NOTE — Patient Instructions (Addendum)
Colonoscopy A colonoscopy is an exam to evaluate your entire colon. In this exam, your colon is cleansed. A long fiberoptic tube is inserted through your rectum and into your colon. The fiberoptic scope (endoscope) is a long bundle of enclosed and very flexible fibers. These fibers transmit light to the area examined and send images from that area to your caregiver. Discomfort is usually minimal. You may be given a drug to help you sleep (sedative) during or prior to the procedure. This exam helps to detect lumps (tumors), polyps, inflammation, and areas of bleeding. Your caregiver may also take a small piece of tissue (biopsy) that will be examined under a microscope. LET YOUR CAREGIVER KNOW ABOUT:   Allergies to food or medicine.   Medicines taken, including vitamins, herbs, eyedrops, over-the-counter medicines, and creams.   Use of steroids (by mouth or creams).   Previous problems with anesthetics or numbing medicines.   History of bleeding problems or blood clots.   Previous surgery.   Other health problems, including diabetes and kidney problems.   Possibility of pregnancy, if this applies.  BEFORE THE PROCEDURE   A clear liquid diet may be required for 2 days before the exam.   Ask your caregiver about changing or stopping your regular medications.   Liquid injections (enemas) or laxatives may be required.   A large amount of electrolyte solution may be given to you to drink over a short period of time. This solution is used to clean out your colon.   You should be present 60 minutes prior to your procedure or as directed by your caregiver.  AFTER THE PROCEDURE   If you received a sedative or pain relieving medication, you will need to arrange for someone to drive you home.   Occasionally, there is a little blood passed with the first bowel movement. Do not be concerned.  FINDING OUT THE RESULTS OF YOUR TEST Not all test results are available during your visit. If your test  results are not back during the visit, make an appointment with your caregiver to find out the results. Do not assume everything is normal if you have not heard from your caregiver or the medical facility. It is important for you to follow up on all of your test results. HOME CARE INSTRUCTIONS   It is not unusual to pass moderate amounts of gas and experience mild abdominal cramping following the procedure. This is due to air being used to inflate your colon during the exam. Walking or a warm pack on your belly (abdomen) may help.   You may resume all normal meals and activities after sedatives and medicines have worn off.   Only take over-the-counter or prescription medicines for pain, discomfort, or fever as directed by your caregiver. Do not use aspirin or blood thinners if a biopsy was taken. Consult your caregiver for medicine usage if biopsies were taken.  SEEK IMMEDIATE MEDICAL CARE IF:   You have a fever.   You pass large blood clots or fill a toilet with blood following the procedure. This may also occur 10 to 14 days following the procedure. This is more likely if a biopsy was taken.   You develop abdominal pain that keeps getting worse and cannot be relieved with medicine.  Document Released: 06/19/2000 Document Revised: 06/11/2011 Document Reviewed: 02/02/2008 Exit[employer] Patient Information 2012 [employer]. 

## 2012-02-24 ENCOUNTER — Encounter: Payer: Self-pay | Admitting: Family Medicine

## 2012-05-10 ENCOUNTER — Ambulatory Visit (INDEPENDENT_AMBULATORY_CARE_PROVIDER_SITE_OTHER): Payer: BC Managed Care – PPO | Admitting: Family Medicine

## 2012-05-10 ENCOUNTER — Encounter: Payer: Self-pay | Admitting: Family Medicine

## 2012-05-10 VITALS — BP 107/65 | HR 81 | Temp 99.1°F | Wt 137.0 lb

## 2012-05-10 DIAGNOSIS — F323 Major depressive disorder, single episode, severe with psychotic features: Secondary | ICD-10-CM | POA: Insufficient documentation

## 2012-05-10 DIAGNOSIS — F4321 Adjustment disorder with depressed mood: Secondary | ICD-10-CM

## 2012-05-10 DIAGNOSIS — N390 Urinary tract infection, site not specified: Secondary | ICD-10-CM

## 2012-05-10 DIAGNOSIS — F319 Bipolar disorder, unspecified: Secondary | ICD-10-CM

## 2012-05-10 LAB — POCT UA - MICROSCOPIC ONLY

## 2012-05-10 LAB — POCT URINALYSIS DIPSTICK
Bilirubin, UA: NEGATIVE
Nitrite, UA: NEGATIVE
Spec Grav, UA: 1.02
pH, UA: 6

## 2012-05-10 LAB — TSH: TSH: 3.63 u[IU]/mL (ref 0.350–4.500)

## 2012-05-10 MED ORDER — DIVALPROEX SODIUM 250 MG PO DR TAB: 250.0000 mg | DELAYED_RELEASE_TABLET | Freq: Every day | ORAL | Status: DC

## 2012-05-10 NOTE — Patient Instructions (Signed)
It was nice to meet you today, Kelly Espinoza.  I am sorry you are not feeling well. Urine results were NORMAL. On the survey, you answered yes to many of the questions that were associated with Bipolar disorder. Please start taking Depakote 250 mg daily at bedtime. Continue Celexa 40 mg daily. Schedule follow up appointment with your PCP in 1-2 weeks or sooner as needed.

## 2012-05-10 NOTE — Progress Notes (Signed)
  Subjective:    Patient ID: Kelly Espinoza, female    DOB: 10-08-1955, 56 y.o.   MRN: [id number]  HPI  Patient presents to same day clinic to discuss memory loss.  She has a list of symptoms that have been going on for about 3-4 months.  Patient complains of increasing anger, decreased concentration, forgetting "simple words", increased fatigue/sleep during the day, difficulty sleeping at night, loss of appetite.  Patient is concerned because she has become reclusive which is difficult because she has 2 children with autism.  Patient wants to know if this is related to Celexa.  She has been on it for about one year.    Patient denies SI/HI ideations.  Denies any alcohol, tobacco, or illicit drug use.  Denies family hx of bipolar DO, but says she has been told she had been "manic" in the past.    Additionally, she wants to have urine checked today for hx of recurrent UTI.  She complains of increased urinary frequency only.   Review of Systems  Per HPI    Objective:   Physical Exam  Constitutional: No distress.       Disheveled appearing  Psychiatric: Her speech is normal. Her mood appears anxious. She is agitated. She expresses no homicidal and no suicidal ideation.       Tearful during encounter       Assessment & Plan:

## 2012-05-10 NOTE — Assessment & Plan Note (Addendum)
Patient with hx of major depression and possible bipolar disorder (mood disorder score 12).  No known family hx of bipolar disorder, but she thinks she has been told she was manic in the past.  No SI/HI ideations. - Start Depakote 250 mg QHS - Continue Celexa 40 mg daily - Check TSH today - Close follow up with PCP in 1-2 weeks

## 2012-05-11 ENCOUNTER — Encounter: Payer: Self-pay | Admitting: Family Medicine

## 2012-05-26 ENCOUNTER — Telehealth: Payer: Self-pay | Admitting: Family Medicine

## 2012-05-26 NOTE — Telephone Encounter (Signed)
Received a call from patient on the emergency line. She called because she had a vividly upsetting dream today, while taking depakote. She has been taking depakote since her last office visit on 05/10/12 for symptoms of bipolar disorder. She is also on celexa. She reports that she has been having bad dreams and hallucinations since she started the depakote, but that today, the nightmare was worst than ever, making her fearful to fall back asleep. She called to see what she should do. She has an appointment tomorrow at 1:30 at [facility].  I looked up the side effects of depakote, which do in rare cases include bad dreams and hallucinations. Tapering is recommended to avoid the risk of seizure. I did not have the means to taper medicine tonight and recommended that she take her dose tonight and follow up tomorrow for further management. She also asked if she could take nyquil or benadryl to help her sleep tonight. To avoid the risk of CNS depression, I recommended that she avoid these medications. Patient agreed and expressed understanding.  She reported feeling more low than up. She denied any SI or HI although she did state that she had had those thoughts in her dream but not once she woke up. She denied feeling like she was a danger to herself or others.

## 2012-05-27 ENCOUNTER — Encounter: Payer: Self-pay | Admitting: Family Medicine

## 2012-05-27 ENCOUNTER — Ambulatory Visit (INDEPENDENT_AMBULATORY_CARE_PROVIDER_SITE_OTHER): Payer: BC Managed Care – PPO | Admitting: Family Medicine

## 2012-05-27 VITALS — BP 110/66 | HR 84 | Temp 98.3°F | Ht 65.5 in | Wt 138.1 lb

## 2012-05-27 DIAGNOSIS — F319 Bipolar disorder, unspecified: Secondary | ICD-10-CM

## 2012-05-27 MED ORDER — QUETIAPINE FUMARATE 100 MG PO TABS: ORAL_TABLET | ORAL | Status: DC

## 2012-05-27 NOTE — Assessment & Plan Note (Signed)
-   She is severely depressed.  - She has had hallucinations and nightmares on Depakote> d/c today - She has had hallucinations in the past as well, not on medications.  - She is currently on Celexa for her depression and d/t QT prolongation we are going to ween her off of this medication, since we are starting Seroquel today (which can also cause QT prolongation.)  - Celexa 1/2 pill (20mg ) for one week, then d/c.   - Seroquel start taper up to goal of --> 200 mg BID (instructions provided) - AVS on anger management - [facility] contact information given today. I feel this patient needs a psychiatrist to cope with her depression, anger and psychosis.  - follow up in 2 weeks

## 2012-05-27 NOTE — Progress Notes (Signed)
Subjective:     Patient ID: Kelly Espinoza, female   DOB: July 29, 1955, 56 y.o.   MRN: [id number]  HPI Major Depression with psychotic features: Patient was seen for a follow to an appointment she had here two weeks ago. She was felt to be bipolar and started on Depakote.  She had sever night terrors on this medication and wishes to try another medication. She is having feelings of anger, failure as a mother and wife, she is no longer able to find joy in  Her normal activities, she has no energy and she is feeling judged. 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 She claims she would not harm anyone physically. She denies suicidal ideations. She claims the Depakote is also causing her to have hallucinations (auditoruy and visual). She reports seeing others in the room when they are not, she also heard a telephone ring and there was none. She does however endorse times in the past she has seen people that were not actually present. Once her brother? In the back seat of her car through the rear view mirror. This person was no there. She denies ever having moments of mania. She does not ever feel energetic or have the ability to go without sleep. She may have some incidents of paranoia symptoms as well, however it is difficult to assess fully in the time frame.   She has two autistic children. One lives in the home and the other is >18 and lives in a group home.   Review of Systems See above HPI    Objective:   Physical Exam  Constitutional: She appears well-developed. She is cooperative.       Tearful. Extremely upset and saddened. Disheveled appearance. She is a very pleasant women that seems extremely saddened about her condition and what it is doing to her life.  Cardiovascular: Normal rate, regular rhythm and normal heart sounds.   No murmur heard. Pulmonary/Chest: Effort normal and breath  sounds normal. She has no wheezes. She has no rales.  Psychiatric: Her speech is normal. She exhibits a depressed mood. She expresses no homicidal and no suicidal ideation.       Agitated when she speaks of areas and incidents in her life she was disrespected or had no control of a situation. May be some paranoid behavior, but difficult to assess in a short period of time. "students and teacher's aide was plotting against me." "I am allergic to many different medicines." "The school system and two of my psychiatrist in the past ganged up on me."

## 2012-05-27 NOTE — Patient Instructions (Signed)
Please call the [facility] facility and obtain an appointment to talk with someone about your depression and history with hallucinations.  It was wonderful seeing you again today Kafi. I hope we can work together to get you feeling better.  [facility] [address], [place] [address] [phone]     Anger Management Anger is a normal human emotion. However, anger can range from mild irritation to rage. When your anger becomes harmful to yourself or others, it is unhealthy anger.  CAUSES  There are many reasons for unhealthy anger. Many people learn how to express anger from observing how their family expressed anger. In troubled, chaotic, or abusive families, anger can be expressed as rage or even violence. Children can grow up never learning how healthy anger can be expressed. Factors that contribute to unhealthy anger include:   Drug or alcohol abuse.  Post-traumatic stress disorder.  Traumatic brain injury. COMPLICATIONS  People with unhealthy anger tend to overreact and retaliate against a real or imagined threat. The need to retaliate can turn into violence or verbal abuse against another person. Chronic anger can lead to health problems, such as hypertension, high blood pressure, and depression. TREATMENT  Exercising, relaxing, meditating, or writing out your feelings all can be beneficial in managing moderate anger. For unhealthy anger, the following methods may be used:  Cognitive-behavioral counseling (learning skills to change the thoughts that influence your mood).  Relaxation training.  Interpersonal counseling.  Assertive communication skills.  Medication. Document Released: 04/19/2007 Document Revised: 09/14/2011 Document Reviewed: 08/28/2010 [facility] Patient Information 2013 [place], [place].

## 2012-06-23 ENCOUNTER — Ambulatory Visit: Payer: BC Managed Care – PPO | Admitting: Family Medicine

## 2012-07-23 ENCOUNTER — Other Ambulatory Visit: Payer: Self-pay | Admitting: Family Medicine

## 2012-07-23 DIAGNOSIS — F319 Bipolar disorder, unspecified: Secondary | ICD-10-CM

## 2012-07-23 MED ORDER — QUETIAPINE FUMARATE 100 MG PO TABS: 200.0000 mg | ORAL_TABLET | Freq: Two times a day (BID) | ORAL | Status: DC

## 2012-07-23 NOTE — Progress Notes (Signed)
Received call from patient stating she was almost out of seroquel.  She states that she thought she had a refill left on the bottle but realized today that she didn't. States that she will "fall apart" without this medication.  I told her that we typically don't fill rx over the phone but that I would give her 5 days worth and she would need to follow up with her primary care physician for further refills.

## 2012-07-25 ENCOUNTER — Other Ambulatory Visit: Payer: Self-pay | Admitting: Family Medicine

## 2012-07-25 ENCOUNTER — Telehealth: Payer: Self-pay | Admitting: *Deleted

## 2012-07-25 DIAGNOSIS — F319 Bipolar disorder, unspecified: Secondary | ICD-10-CM

## 2012-07-25 MED ORDER — QUETIAPINE FUMARATE 100 MG PO TABS: 200.0000 mg | ORAL_TABLET | Freq: Two times a day (BID) | ORAL | Status: DC

## 2012-07-25 NOTE — Telephone Encounter (Signed)
Called and lvm for pt to call back. When she calls back she can be told the following:  Her meds have been called in and she will need to make an appt prior to ANY MORE MEDICATIONS BEING PRESCRIBED!!!

## 2012-07-26 ENCOUNTER — Telehealth: Payer: Self-pay | Admitting: Family Medicine

## 2012-07-26 NOTE — Telephone Encounter (Signed)
Pt has made appt for 1/27 to see [facility] and she only has 3 days left on her seroquil - wants to know if she can have enough until appt.

## 2012-07-26 NOTE — Telephone Encounter (Signed)
LVM for patient to call back, requested medication was sent in yesterday----[facility] in [place]

## 2012-08-01 ENCOUNTER — Encounter: Payer: Self-pay | Admitting: Family Medicine

## 2012-08-01 ENCOUNTER — Ambulatory Visit (INDEPENDENT_AMBULATORY_CARE_PROVIDER_SITE_OTHER): Payer: BC Managed Care – PPO | Admitting: Family Medicine

## 2012-08-01 VITALS — BP 120/68 | HR 102 | Temp 98.7°F | Ht 65.5 in | Wt 151.3 lb

## 2012-08-01 DIAGNOSIS — J309 Allergic rhinitis, unspecified: Secondary | ICD-10-CM

## 2012-08-01 DIAGNOSIS — F323 Major depressive disorder, single episode, severe with psychotic features: Secondary | ICD-10-CM

## 2012-08-01 DIAGNOSIS — Z0000 Encounter for general adult medical examination without abnormal findings: Secondary | ICD-10-CM

## 2012-08-01 DIAGNOSIS — J302 Other seasonal allergic rhinitis: Secondary | ICD-10-CM | POA: Insufficient documentation

## 2012-08-01 MED ORDER — CETIRIZINE HCL 10 MG PO TABS: 10.0000 mg | ORAL_TABLET | Freq: Every day | ORAL | Status: DC

## 2012-08-01 NOTE — Patient Instructions (Signed)
It was wonderful to see you again Kelly Espinoza. I am very pleased you are doing well. I would like to follow-up with you in 6 months for your depression. If you have any problems prior that you need to discuss or been seen for, please do not hesitate to make an appointment.   Please make an appointment with our lab to have your cholesterol screened. I will place a standing order for you to have this completed. I also will give you information today concerning the arrangement of a screening colonoscopy.   Colonoscopy A colonoscopy is an exam to evaluate your entire colon. In this exam, your colon is cleansed. A long fiberoptic tube is inserted through your rectum and into your colon. The fiberoptic scope (endoscope) is a long bundle of enclosed and very flexible fibers. These fibers transmit light to the area examined and send images from that area to your caregiver. Discomfort is usually minimal. You may be given a drug to help you sleep (sedative) during or prior to the procedure. This exam helps to detect lumps (tumors), polyps, inflammation, and areas of bleeding. Your caregiver may also take a small piece of tissue (biopsy) that will be examined under a microscope. LET YOUR CAREGIVER KNOW ABOUT:   Allergies to food or medicine.  Medicines taken, including vitamins, herbs, eyedrops, over-the-counter medicines, and creams.  Use of steroids (by mouth or creams).  Previous problems with anesthetics or numbing medicines.  History of bleeding problems or blood clots.  Previous surgery.  Other health problems, including diabetes and kidney problems.  Possibility of pregnancy, if this applies. BEFORE THE PROCEDURE   A clear liquid diet may be required for 2 days before the exam.  Ask your caregiver about changing or stopping your regular medications.  Liquid injections (enemas) or laxatives may be required.  A large amount of electrolyte solution may be given to you to drink over a short period  of time. This solution is used to clean out your colon.  You should be present 60 minutes prior to your procedure or as directed by your caregiver. AFTER THE PROCEDURE   If you received a sedative or pain relieving medication, you will need to arrange for someone to drive you home.  Occasionally, there is a little blood passed with the first bowel movement. Do not be concerned. FINDING OUT THE RESULTS OF YOUR TEST Not all test results are available during your visit. If your test results are not back during the visit, make an appointment with your caregiver to find out the results. Do not assume everything is normal if you have not heard from your caregiver or the medical facility. It is important for you to follow up on all of your test results. HOME CARE INSTRUCTIONS   It is not unusual to pass moderate amounts of gas and experience mild abdominal cramping following the procedure. This is due to air being used to inflate your colon during the exam. Walking or a warm pack on your belly (abdomen) may help.  You may resume all normal meals and activities after sedatives and medicines have worn off.  Only take over-the-counter or prescription medicines for pain, discomfort, or fever as directed by your caregiver. Do not use aspirin or blood thinners if a biopsy was taken. Consult your caregiver for medicine usage if biopsies were taken. SEEK IMMEDIATE MEDICAL CARE IF:   You have a fever.  You pass large blood clots or fill a toilet with blood following the procedure. This  may also occur 10 to 14 days following the procedure. This is more likely if a biopsy was taken.  You develop abdominal pain that keeps getting worse and cannot be relieved with medicine. Document Released: 06/19/2000 Document Revised: 09/14/2011 Document Reviewed: 02/02/2008 [facility] Patient Information 2013 [place], [place].

## 2012-08-01 NOTE — Progress Notes (Signed)
Subjective:     Patient ID: Kelly Espinoza, female   DOB: 07-Oct-1955, 57 y.o.   MRN: [id number]  HPI F/U to severe depression:  Ailani's reports she feels better. She has tolerated her seroquel dose nicely and feels it has helped her tremendously. She is more stable emotionally. She admits to still feeling guilty about her role as a mother and wife, but she is attempting to deal with those feelings. 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 She feels it has become to much for her to handle. 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 She admits she no longer thinks this is directed at her, as she did prior, because they are disrespectful to the superintendent as well. She calls them "monsters", then quickly apologizes, stating no one should be called that.   She has begun to scribe everything that occurs around her (and continuously does so during our appointment), from tasks, appointments, discussion points, ideas for her "book". 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  She has been attempting to research autism more, to see if there is any advancements since her children were diagnosed and feels she can not find anything new in the last 10 years. She admits to a little frustration to not being able to offer her children more help. She admits she feels some guilt to not being able to help out financially at home, and her husband is caring the burden.    She has begun to be more involved at church and is helping with their genecology libray. She seems quite pleased with this role.  Her family is mormon.   She has no side effects to mention from Seroquel, with the exception of weight gain. She reports she is ok with the weight and feels she looks better.   Review of Systems  Constitutional: Positive for appetite change and  unexpected weight change. Negative for fatigue.  Neurological: Negative for dizziness and headaches.  Psychiatric/Behavioral: Negative for hallucinations, sleep disturbance, decreased concentration and agitation.  All other systems reviewed and are negative.       Objective:   Physical Exam BP 120/68  Pulse 102  Temp 98.7 F (37.1 C) (Oral)  Ht 5' 5.5" (1.664 m)  Wt 151 lb 5 oz (68.635 kg)  BMI 24.80 kg/m2 Gen: She looks great today. She has her hair colored and styled. She is dressed nicely. Smiling. Continuously writing in her notebook, restless but cooperative. Appears more healthy than last visit. CV: RRR. No murmur Lungs: CTAB Psych: Her mood is happy and good. Her affect appears to be appropriate. Her speech is pressured at times and she demonstrates both circumstantiality and tangentiality in her thought processes. She is less paranoid than our last visit. Her behavior is restless or hyperactive with her writing in her notebook and topics. She shows no signs of depression or SI/HI.

## 2012-08-03 NOTE — Assessment & Plan Note (Addendum)
-   She has been taking her Seroquel as prescribed and feels her mood is more stable.  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   - Better relationships with husband and children.   - Frustration and paranoia better controlled  - She now writes everything in a notebook she carries around with her. (Appts, discussion points, tasks, plus ideas/characters for book) - Declined [facility] assistance with her mental illness. "She does not want to talk about her feelings" - Will continue Seroquel at current dose of 200 mg BID - Standing order for Cholesterol screen, poss. SE to Seroquel. - She has declined the flu shot

## 2012-09-03 ENCOUNTER — Other Ambulatory Visit: Payer: Self-pay | Admitting: Family Medicine

## 2012-09-03 NOTE — Telephone Encounter (Signed)
Pt called the after hours line concerning her Seroquel States that her Rx ran out last night and called pharmacy and was told she did not have any more refills. Told at last PCP visit that she would be given 6mo worth of refills.   CHecked phone and clinic records and it appears that pt was cleared to have Rx for 6 mo. Previous fill was done prior to clinic visit.   Will fill Seroquel for 6 mo as requested, and fwd message to PCP.   Shelly Flatten, MD Family Medicine PGY-2 09/03/2012, 10:54 AM

## 2012-11-02 ENCOUNTER — Telehealth: Payer: Self-pay | Admitting: *Deleted

## 2012-11-02 NOTE — Telephone Encounter (Signed)
Pt reports that she feels dizzy upon standing but is fine when she sits down or lays down. Denies any other signs/ symptoms/headaches or vision changes.  Pt also states that " I can't wear pants that are the slightest bit snug, they have to be at least an inch lose or I just get all dizzy like". Informed pt that message would be given to PCP and I would return call this afternoon to discuss further instructions. Poet Hineman, Harold Hedge, RN

## 2012-11-02 NOTE — Telephone Encounter (Signed)
Phone call returned. Pt states that she is feeling better. "I believe that I may have been dehydrated" pt does not feel that it is necessary at this time to make appointment or seek further emergency care. Instructed to call if symptoms returned, worsened or go to the ED Zana Biancardi, Harold Hedge, RN

## 2012-11-02 NOTE — Telephone Encounter (Signed)
Patient probably should come in to be seen if this continues to be an issue. There are many reasons for dizziness, some emergent and some not. She can attempt to make sure she is well hydrated, and monitor her BP. I would suggest her to make an appointment for evaluation or go to the ED if she feels it is emergent.

## 2012-11-30 ENCOUNTER — Emergency Department (INDEPENDENT_AMBULATORY_CARE_PROVIDER_SITE_OTHER)
Admission: EM | Admit: 2012-11-30 | Discharge: 2012-11-30 | Disposition: A | Discharge status: Home / Self Care | Payer: BC Managed Care – PPO | Source: Home / Self Care

## 2012-11-30 ENCOUNTER — Encounter (HOSPITAL_COMMUNITY): Payer: Self-pay | Admitting: *Deleted

## 2012-11-30 ENCOUNTER — Emergency Department (INDEPENDENT_AMBULATORY_CARE_PROVIDER_SITE_OTHER): Payer: BC Managed Care – PPO

## 2012-11-30 DIAGNOSIS — J189 Pneumonia, unspecified organism: Secondary | ICD-10-CM

## 2012-11-30 HISTORY — DX: Nasal congestion: R09.81

## 2012-11-30 LAB — POCT RAPID STREP A: Streptococcus, Group A Screen (Direct): NEGATIVE

## 2012-11-30 MED ORDER — LIDOCAINE HCL (PF) 1 % IJ SOLN
INTRAMUSCULAR | Status: AC
  Filled 2012-11-30: qty 5

## 2012-11-30 MED ORDER — DOXYCYCLINE HYCLATE 100 MG PO CAPS: 100.0000 mg | ORAL_CAPSULE | Freq: Two times a day (BID) | ORAL | Status: DC

## 2012-11-30 MED ORDER — CEFTRIAXONE SODIUM 1 G IJ SOLR
1.0000 g | Freq: Once | INTRAMUSCULAR | Status: AC
  Administered 2012-11-30: 1 g via INTRAMUSCULAR

## 2012-11-30 MED ORDER — CEFTRIAXONE SODIUM 1 G IJ SOLR
INTRAMUSCULAR | Status: AC
  Filled 2012-11-30: qty 10

## 2012-11-30 NOTE — ED Notes (Addendum)
C/o sore throat onset Sunday.  C/o bil earache, stuffy nose and congestion in her chest.  No chills or fever. Triage took longer due to pt. Being HOH and Kelly Espinoza came in to do EKG.

## 2012-11-30 NOTE — ED Provider Notes (Signed)
History     CSN: [id number]  Arrival date & time 11/30/12  1727   None     Chief Complaint  Patient presents with  . Sore Throat    (Consider location/radiation/quality/duration/timing/severity/associated sxs/prior treatment) HPI Comments: Pt presents c/o cough, sore throat, ear pain, losing her voice, and feeling generally unwell since she woke up Monday morning.  She mentions she was at a family friend's home where there was a lot of dust which she thinks may have something to do with how she feels now.  She also notes that she is feeling significantly better today than yesterday.     Past Medical History  Diagnosis Date  . Depression   . Congestion of nasal sinus     chronic    Past Surgical History  Procedure Laterality Date  . Cesarean section  1991 abd 1993    x 2  . Ear canaloplasty  1987    L ear and tympanoplasty, mastoidectomy.  . Tympanomastoidectomy  2000    R ear  . Cystoscopy  1977  . Tube reversal  2005    reverse tubal ligation    Family History  Problem Relation Age of Onset  . Breast cancer Mother   . Parkinson's disease Mother     History  Substance Use Topics  . Smoking status: Never Smoker   . Smokeless tobacco: Not on file  . Alcohol Use: No    OB History   Grav Para Term Preterm Abortions TAB SAB Ect Mult Living                  Review of Systems  Constitutional: Positive for fever, chills and fatigue.  HENT: Positive for ear pain, congestion, sore throat, rhinorrhea, trouble swallowing, voice change and sinus pressure. Negative for nosebleeds, sneezing, drooling, mouth sores and postnasal drip.   Eyes: Negative for pain and visual disturbance.  Respiratory: Positive for cough and chest tightness. Negative for shortness of breath.   Cardiovascular: Positive for chest pain (heaviness). Negative for palpitations and leg swelling.  Gastrointestinal: Negative for nausea, vomiting and abdominal pain.  Endocrine: Negative for polydipsia  and polyuria.  Genitourinary: Negative for dysuria, urgency and frequency.  Musculoskeletal: Negative for myalgias and arthralgias.  Skin: Negative for rash.  Neurological: Negative for dizziness, weakness and light-headedness.    Allergies  Amitriptyline; Codeine; Darvocet; Hydrocodone; Sulfonamide derivatives; and Tramadol  Home Medications   Current Outpatient Rx  Name  Route  Sig  Dispense  Refill  . cetirizine (ZYRTEC) 10 MG tablet   Oral   Take 1 tablet (10 mg total) by mouth daily.   30 tablet   11   . QUEtiapine (SEROQUEL) 100 MG tablet   Oral   Take 2 tablets (200 mg total) by mouth 2 (two) times daily.   120 tablet   5   . EXPIRED: citalopram (CELEXA) 40 MG tablet   Oral   Take 1 tablet (40 mg total) by mouth daily.   90 tablet   1   . doxycycline (VIBRAMYCIN) 100 MG capsule   Oral   Take 1 capsule (100 mg total) by mouth 2 (two) times daily.   20 capsule   0     BP 104/73  Pulse 127  Temp(Src) 100.3 F (37.9 C) (Oral)  Resp 18  SpO2 97%  Physical Exam  Nursing note and vitals reviewed. Constitutional: She is oriented to person, place, and time. Vital signs are normal. She appears well-developed and well-nourished. She  appears distressed (mild).  HENT:  Head: Atraumatic.  Right Ear: Hearing and ear canal normal. No drainage. Tympanic membrane is not erythematous. No middle ear effusion.  Left Ear: Hearing and ear canal normal. No drainage. Tympanic membrane is erythematous.  No middle ear effusion.  Mouth/Throat: Uvula is midline. Posterior oropharyngeal erythema present. No tonsillar abscesses.  Eyes: EOM are normal. Pupils are equal, round, and reactive to light.  Cardiovascular: Regular rhythm and normal heart sounds.  Tachycardia present.  Exam reveals no gallop and no friction rub.   No murmur heard. Pulmonary/Chest: Effort normal and breath sounds normal. No respiratory distress. She has no wheezes. She has no rales.  Abdominal: Soft. There  is no tenderness.  Lymphadenopathy:       Head (right side): Tonsillar adenopathy present.       Head (left side): Tonsillar adenopathy present.  Neurological: She is alert and oriented to person, place, and time. She has normal strength.  Skin: Skin is warm and dry. She is not diaphoretic.  Psychiatric: She has a normal mood and affect. Her behavior is normal. Judgment normal.    ED Course  Procedures (including critical care time)  Labs Reviewed  POCT RAPID STREP A ([facility]RG CARE ONLY)   Dg Chest 2 View  11/30/2012   *RADIOLOGY REPORT*  Clinical Data: Cough  CHEST - 2 VIEW  Comparison: Prior chest x-ray 10/08/2005  Findings: Focal opacity in the posterior aspect of the left lower lobe with associated elevation of the left hemidiaphragm.  Cardiac and mediastinal contours remain within normal limits.  No evidence of edema.  No acute osseous abnormality.  IMPRESSION:  Focal posterior left lower lobe opacity with associated elevation of the left hemidiaphragm suggesting an element of volume loss.  Differential considerations include pneumonia and atelectasis.  A pulmonary mass is considered less likely.  Recommend repeat chest x- ray in 4 weeks following an appropriate course of therapy.  If this finding persists, further evaluation with CT scan may become warranted.   Original Report Authenticated By: Malachy Moan, M.D.     1. Community acquired pneumonia    EKG showed sinus tachy only    MDM  Will treat as CAP.  Pt instructed to return here or to ER within a day or two if not improving.  Will give 1 gram rocephin IM here and start on doxy.   *HR down to 100 on re-check    Meds ordered this encounter  Medications  . doxycycline (VIBRAMYCIN) 100 MG capsule    Sig: Take 1 capsule (100 mg total) by mouth 2 (two) times daily.    Dispense:  20 capsule    Refill:  0  . cefTRIAXone (ROCEPHIN) injection 1 g    Sig:           Graylon Good, PA-C 11/30/12 2039

## 2012-12-02 NOTE — ED Provider Notes (Signed)
Medical screening examination/treatment/procedure(s) were performed by resident physician or non-physician practitioner and as supervising physician I was immediately available for consultation/collaboration.   Barkley Bruns MD.   Linna Hoff, MD 12/02/12 2004

## 2012-12-23 ENCOUNTER — Ambulatory Visit: Payer: BC Managed Care – PPO | Admitting: Family Medicine

## 2012-12-23 ENCOUNTER — Telehealth: Payer: Self-pay | Admitting: Family Medicine

## 2012-12-23 NOTE — Telephone Encounter (Signed)
Patient called and cancelled her appt for this afternoon because she is feeling better but she still wants to speak to the nurse about symptoms to watch for.  She thinks she may have a UTI.

## 2012-12-23 NOTE — Telephone Encounter (Signed)
Returned call to pt - states that she had lower back pain and rippling pain across stomach two days ago that has since resolved but has no other UTI symptoms. Also states that she thinks it may be related to constipation. Pt reports that she will take stool softner or Miralax to relieve constipation. Does not feel that she needs to come in today to be seen since symptoms have resolved themselves. Wyatt Haste, RN-BSN

## 2012-12-31 ENCOUNTER — Emergency Department (HOSPITAL_COMMUNITY)
Admission: EM | Admit: 2012-12-31 | Discharge: 2012-12-31 | Disposition: A | Discharge status: Home / Self Care | Payer: BC Managed Care – PPO | Source: Home / Self Care

## 2012-12-31 ENCOUNTER — Encounter (HOSPITAL_COMMUNITY): Payer: Self-pay | Admitting: Emergency Medicine

## 2012-12-31 DIAGNOSIS — J9801 Acute bronchospasm: Secondary | ICD-10-CM

## 2012-12-31 DIAGNOSIS — R0982 Postnasal drip: Secondary | ICD-10-CM

## 2012-12-31 DIAGNOSIS — J329 Chronic sinusitis, unspecified: Secondary | ICD-10-CM

## 2012-12-31 LAB — POCT I-STAT, CHEM 8
BUN: 13 mg/dL (ref 6–23)
Calcium, Ion: 1.21 mmol/L (ref 1.12–1.23)
HCT: 41 % (ref 36.0–46.0)
Hemoglobin: 13.9 g/dL (ref 12.0–15.0)
Sodium: 142 mEq/L (ref 135–145)
TCO2: 28 mmol/L (ref 0–100)

## 2012-12-31 MED ORDER — ALBUTEROL SULFATE HFA 108 (90 BASE) MCG/ACT IN AERS: 2.0000 | INHALATION_SPRAY | RESPIRATORY_TRACT | Status: DC | PRN

## 2012-12-31 MED ORDER — CEFUROXIME AXETIL 250 MG PO TABS: 250.0000 mg | ORAL_TABLET | Freq: Two times a day (BID) | ORAL | Status: DC

## 2012-12-31 NOTE — ED Provider Notes (Signed)
History    CSN: [id number] Arrival date & time 12/31/12  1101  First MD Initiated Contact with Patient 12/31/12 1110     Chief Complaint  Patient presents with  . URI   (Consider location/radiation/quality/duration/timing/severity/associated sxs/prior Treatment) HPI Comments: History is a bit confusing and difficult to sort out. This 48 her old female is accompanied by her husband stating that she feels similar to her visit last month when she had upper respiratory congestion, drainage, chest congestion, sore throat, cough and vomiting particularly with her cough. At that time there was some suspicion that she had either atelectasis or CAP. She was treated with antibiotics. Today her complaint is that in some way she is better anyway she did not. The best I can gather is that she still has persistent upper respiratory symptoms with PND and cough. Often with cough she has posttussive emesis. She continues to have upper respiratory drainage and dry cough. She has taken all of her antibiotics as directed. He states that she has a long history of anorexia, diminished hearing in difficulty in standing for prolonged times that she attributes to her diet. She is having trouble standing today preferring to sit in a wheelchair and she states this is not unusual for her.  Past Medical History  Diagnosis Date  . Depression   . Congestion of nasal sinus     chronic   Past Surgical History  Procedure Laterality Date  . Cesarean section  1991 abd 1993    x 2  . Ear canaloplasty  1987    L ear and tympanoplasty, mastoidectomy.  . Tympanomastoidectomy  2000    R ear  . Cystoscopy  1977  . Tube reversal  2005    reverse tubal ligation   Family History  Problem Relation Age of Onset  . Breast cancer Mother   . Parkinson's disease Mother    History  Substance Use Topics  . Smoking status: Never Smoker   . Smokeless tobacco: Not on file  . Alcohol Use: No   OB History   Grav Para Term  Preterm Abortions TAB SAB Ect Mult Living                 Review of Systems  Constitutional: Positive for appetite change and fatigue. Negative for fever, chills, diaphoresis and activity change.  HENT: Positive for hearing loss, congestion, sore throat, rhinorrhea and postnasal drip. Negative for ear pain, facial swelling, trouble swallowing, neck pain, neck stiffness, sinus pressure and ear discharge.   Eyes: Negative.   Respiratory: Negative.   Cardiovascular: Negative.   Genitourinary: Negative.   Skin: Negative for pallor and rash.  Neurological: Positive for weakness and headaches. Negative for seizures, syncope and speech difficulty.  Psychiatric/Behavioral: Positive for agitation.    Allergies  Amitriptyline; Codeine; Darvocet; Hydrocodone; Sulfonamide derivatives; and Tramadol  Home Medications   Current Outpatient Rx  Name  Route  Sig  Dispense  Refill  . albuterol (PROVENTIL HFA;VENTOLIN HFA) 108 (90 BASE) MCG/ACT inhaler   Inhalation   Inhale 2 puffs into the lungs every 4 (four) hours as needed for wheezing.   1 Inhaler   0   . cefUROXime (CEFTIN) 250 MG tablet   Oral   Take 1 tablet (250 mg total) by mouth 2 (two) times daily.   14 tablet   0   . cetirizine (ZYRTEC) 10 MG tablet   Oral   Take 1 tablet (10 mg total) by mouth daily.   30 tablet  11   . EXPIRED: citalopram (CELEXA) 40 MG tablet   Oral   Take 1 tablet (40 mg total) by mouth daily.   90 tablet   1   . doxycycline (VIBRAMYCIN) 100 MG capsule   Oral   Take 1 capsule (100 mg total) by mouth 2 (two) times daily.   20 capsule   0   . QUEtiapine (SEROQUEL) 100 MG tablet   Oral   Take 2 tablets (200 mg total) by mouth 2 (two) times daily.   120 tablet   5    BP 102/63  Pulse 120  Temp(Src) 98 F (36.7 C) (Oral)  Resp 20  SpO2 100% Physical Exam  Nursing note and vitals reviewed. Constitutional: She is oriented to person, place, and time. She appears well-developed and  well-nourished. No distress.  HENT:  Bilateral TMs are mildly erythematous. No bulging retraction. Oropharynx with erythema in a copious amount of thick white to gray drainage covering the posterior pharynx.  Eyes: Conjunctivae and EOM are normal.  Neck: Normal range of motion. Neck supple.  Cardiovascular: Normal rate and regular rhythm.   Pulmonary/Chest: Effort normal and breath sounds normal. No respiratory distress. She has no rales.  Taking a deep breath causes her to cough. There is a rare wheeze with forced expiration.  Musculoskeletal: Normal range of motion. She exhibits no edema.  Lymphadenopathy:    She has no cervical adenopathy.  Neurological: She is alert and oriented to person, place, and time.  Skin: Skin is warm and dry. No rash noted.  Psychiatric: She has a normal mood and affect.    ED Course  Procedures (including critical care time) Labs Reviewed  POCT I-STAT, CHEM 8 - Abnormal; Notable for the following:    Glucose, Bld 104 (*)    All other components within normal limits   No results found. 1. Sinusitis   2. PND (post-nasal drip)   3. Cough due to bronchospasm   4. Bronchospasm     MDM  Symptoms are suggestive of either a bacterial sinusitis or persistent allergic sinusitis/rhinitis. She is having no additional fever, shortness of breath or chest pain. Her dry mouth is results of the Zyrtec. The i-STAT is normal and is less likely that her dry mouth is due to dehydration. Said she has had problems and weakness with standing for "a long time" I do not think this is an acute problem. She is instructed to drink plenty of fluids and stay well-hydrated. Start Ceftin 250 mg twice a day, stop the Zyrtec and try Allegra 180 mg daily start albuterol HFA 2 puffs every 4-6 hours when necessary cough and wheezing. For any new symptoms problems or worsening may return otherwise follow up with your doctor next week.  Hayden Rasmussen, NP 12/31/12 1223

## 2012-12-31 NOTE — ED Provider Notes (Signed)
Medical screening examination/treatment/procedure(s) were performed by non-physician practitioner and as supervising physician I was immediately available for consultation/collaboration.  Leslee Home, M.D.  Reuben Likes, MD 12/31/12 [phone]

## 2012-12-31 NOTE — ED Notes (Signed)
Pt c/o persistent URI sxs...  Seen here on 11/30/12 and dx w/Pneumonia... Took antibiotics and tolerated well... sxs today include: light headed, cough w/streaks of blood, vomiting due to cough... Denies: SOB, wheezing, fevers, diarrhea... She is alert and oriented w/no signs of acute distress.

## 2013-01-04 ENCOUNTER — Emergency Department (HOSPITAL_COMMUNITY)
Admission: EM | Admit: 2013-01-04 | Discharge: 2013-01-04 | Disposition: A | Discharge status: Home / Self Care | Payer: BC Managed Care – PPO | Attending: Emergency Medicine | Admitting: Emergency Medicine

## 2013-01-04 ENCOUNTER — Encounter (HOSPITAL_COMMUNITY): Payer: Self-pay | Admitting: *Deleted

## 2013-01-04 DIAGNOSIS — Z8719 Personal history of other diseases of the digestive system: Secondary | ICD-10-CM | POA: Insufficient documentation

## 2013-01-04 DIAGNOSIS — R55 Syncope and collapse: Secondary | ICD-10-CM

## 2013-01-04 DIAGNOSIS — I959 Hypotension, unspecified: Secondary | ICD-10-CM | POA: Insufficient documentation

## 2013-01-04 DIAGNOSIS — R000 Tachycardia, unspecified: Secondary | ICD-10-CM | POA: Insufficient documentation

## 2013-01-04 DIAGNOSIS — Z79899 Other long term (current) drug therapy: Secondary | ICD-10-CM | POA: Insufficient documentation

## 2013-01-04 DIAGNOSIS — Z862 Personal history of diseases of the blood and blood-forming organs and certain disorders involving the immune mechanism: Secondary | ICD-10-CM | POA: Insufficient documentation

## 2013-01-04 DIAGNOSIS — E860 Dehydration: Secondary | ICD-10-CM

## 2013-01-04 DIAGNOSIS — R0602 Shortness of breath: Secondary | ICD-10-CM | POA: Insufficient documentation

## 2013-01-04 DIAGNOSIS — F329 Major depressive disorder, single episode, unspecified: Secondary | ICD-10-CM | POA: Insufficient documentation

## 2013-01-04 DIAGNOSIS — Z8639 Personal history of other endocrine, nutritional and metabolic disease: Secondary | ICD-10-CM | POA: Insufficient documentation

## 2013-01-04 DIAGNOSIS — F3289 Other specified depressive episodes: Secondary | ICD-10-CM | POA: Insufficient documentation

## 2013-01-04 HISTORY — DX: Hypoglycemia, unspecified: E16.2

## 2013-01-04 HISTORY — DX: Irritable bowel syndrome, unspecified: K58.9

## 2013-01-04 LAB — CBC WITH DIFFERENTIAL/PLATELET
Basophils Relative: 0 % (ref 0–1)
Eosinophils Absolute: 0.1 10*3/uL (ref 0.0–0.7)
Lymphs Abs: 2.6 10*3/uL (ref 0.7–4.0)
MCH: 30.5 pg (ref 26.0–34.0)
MCHC: 34.3 g/dL (ref 30.0–36.0)
Neutrophils Relative %: 54 % (ref 43–77)
Platelets: 214 10*3/uL (ref 150–400)
RBC: 4.33 MIL/uL (ref 3.87–5.11)

## 2013-01-04 LAB — BASIC METABOLIC PANEL
GFR calc Af Amer: >90 mL/min (ref >90)
GFR calc non Af Amer: >90 mL/min (ref >90)
Potassium: 3.4 mEq/L — ABNORMAL LOW (ref 3.5–5.1)
Sodium: 140 mEq/L (ref 135–145)

## 2013-01-04 LAB — URINE MICROSCOPIC-ADD ON

## 2013-01-04 LAB — URINALYSIS, ROUTINE W REFLEX MICROSCOPIC
Nitrite: NEGATIVE
Specific Gravity, Urine: 1.015 (ref 1.005–1.030)
Urobilinogen, UA: 0.2 mg/dL (ref 0.0–1.0)

## 2013-01-04 MED ORDER — SODIUM CHLORIDE 0.9 % IV SOLN: 1000.0000 mL | INTRAVENOUS | Status: DC

## 2013-01-04 MED ORDER — SODIUM CHLORIDE 0.9 % IV SOLN
1000.0000 mL | Freq: Once | INTRAVENOUS | Status: AC
  Administered 2013-01-04: 1000 mL via INTRAVENOUS

## 2013-01-04 MED ORDER — SODIUM CHLORIDE 0.9 % IV BOLUS (SEPSIS)
1000.0000 mL | Freq: Once | INTRAVENOUS | Status: AC
  Administered 2013-01-04: 1000 mL via INTRAVENOUS

## 2013-01-04 NOTE — ED Notes (Signed)
Order for another bag if HR got above 100 when standing. HR was 94 when walking into pt room. Went up to 102 before sitting all the way up. NAD. PT received one liter of fluid prior to me taking over at 1500.

## 2013-01-04 NOTE — ED Notes (Addendum)
Feels faint, "like I would pass out" denies pain,No NV, alert, Says her bp "runs low" in the 80's

## 2013-01-04 NOTE — ED Notes (Signed)
Pt requested water and and jello. Jello order from cafeteria.

## 2013-01-04 NOTE — ED Provider Notes (Signed)
History    CSN: [id number] Arrival date & time 01/04/13  1233  First MD Initiated Contact with Patient 01/04/13 1252     No chief complaint on file.  (Consider location/radiation/quality/duration/timing/severity/associated sxs/prior Treatment) HPI Patient reports when she got up this morning she felt fine. She states she was going to have a late breakfast and she suddenly felt cold from the top of her head down to her fingertips and she went back to lay down on the couch and slowly felt better. However when she tried to get up again she felt bad again. She called her nephew who said her face looked flushed and he had to assist her to go to the car to come to the emergency room. She denies chest pain, she states she had mild shortness of breath, she denies headache, nausea, vomiting, or diarrhea. She states she had something similar about 2 years ago when she felt faint when she had a UTI. She states she was treated for pneumonia at the end of May and then a sinus infection 4 days ago when she was seen at the urgent care. She denies a spinning sensation. She states she has had a decreased appetite since she had pneumonia and a sinus infection. She states she did eat a piece of pie and drink potato milk prior to coming to the emergency department. She states she did drink an energy drink when she got up this morning but she has to do because of lack of energy for a long time.    [facility]  Past Medical History  Diagnosis Date  . Depression   . Congestion of nasal sinus     chronic  . IBS (irritable bowel syndrome)   . Hypoglycemia    Past Surgical History  Procedure Laterality Date  . Cesarean section  1991 abd 1993    x 2  . Ear canaloplasty  1987    L ear and tympanoplasty, mastoidectomy.  . Tympanomastoidectomy  2000    R ear  . Cystoscopy  1977  . Tube reversal  2005    reverse tubal ligation  . Joint replacement    . Total hip arthroplasty     Family History  Problem Relation  Age of Onset  . Breast cancer Mother   . Parkinson's disease Mother    History  Substance Use Topics  . Smoking status: Never Smoker   . Smokeless tobacco: Not on file  . Alcohol Use: No   Lives at home Lives with spouse  Stay at home mother   OB History   Grav Para Term Preterm Abortions TAB SAB Ect Mult Living                 Review of Systems  All other systems reviewed and are negative.    Allergies  Amitriptyline; Codeine; Darvocet; Donnatal; Hydrocodone; Lactose intolerance (gi); Sulfonamide derivatives; and Tramadol  Home Medications   Current Outpatient Rx  Name  Route  Sig  Dispense  Refill  . albuterol (PROVENTIL HFA;VENTOLIN HFA) 108 (90 BASE) MCG/ACT inhaler   Inhalation   Inhale 2 puffs into the lungs every 4 (four) hours as needed for wheezing.   1 Inhaler   0   . cefUROXime (CEFTIN) 250 MG tablet   Oral   Take 1 tablet (250 mg total) by mouth 2 (two) times daily.   14 tablet   0   . fexofenadine-pseudoephedrine (ALLEGRA-D 24) 180-240 MG per 24 hr tablet   Oral  Take 1 tablet by mouth daily.         . QUEtiapine (SEROQUEL) 100 MG tablet   Oral   Take 2 tablets (200 mg total) by mouth 2 (two) times daily.   120 tablet   5    BP 120/73  Pulse 106  Temp(Src) 98.1 F (36.7 C) (Oral)  Resp 18  Ht 5' 5.5" (1.664 m)  Wt 135 lb (61.236 kg)  BMI 22.12 kg/m2  SpO2 94%  Vital signs normal except tachycardia  Physical Exam  Nursing note and vitals reviewed. Constitutional: She is oriented to person, place, and time. She appears well-developed and well-nourished.  Non-toxic appearance. She does not appear ill. No distress.  HENT:  Head: Normocephalic and atraumatic.  Right Ear: External ear normal.  Left Ear: External ear normal.  Nose: Nose normal. No mucosal edema or rhinorrhea.  Mouth/Throat: Mucous membranes are normal. No dental abscesses or edematous.  Dry tongue  Eyes: Conjunctivae and EOM are normal. Pupils are equal, round, and  reactive to light.  Neck: Normal range of motion and full passive range of motion without pain. Neck supple.  Cardiovascular: Normal rate, regular rhythm and normal heart sounds.  Exam reveals no gallop and no friction rub.   No murmur heard. Pulmonary/Chest: Effort normal and breath sounds normal. No respiratory distress. She has no wheezes. She has no rhonchi. She has no rales. She exhibits no tenderness and no crepitus.  Abdominal: Soft. Normal appearance and bowel sounds are normal. She exhibits no distension. There is no tenderness. There is no rebound and no guarding.  Musculoskeletal: Normal range of motion. She exhibits no edema and no tenderness.  Moves all extremities well.   Neurological: She is alert and oriented to person, place, and time. She has normal strength. No cranial nerve deficit.  Skin: Skin is warm, dry and intact. No rash noted. No erythema. There is pallor.  Psychiatric: She has a normal mood and affect. Her speech is normal and behavior is normal. Her mood appears not anxious.    ED Course  Procedures (including critical care time)  Medications  0.9 %  sodium chloride infusion (1,000 mLs Intravenous New Bag/Given 01/04/13 1526)    Followed by  0.9 %  sodium chloride infusion (not administered)  sodium chloride 0.9 % bolus 1,000 mL (not administered)   Patient did have persistent tachycardia on her orthostatic vital signs. However when she was placed on her stretcher her heart rate now is in the 80s. We discussed stopping the energy drinks aching sports drinks. She also has not been eating or drinking well because of her recent infections. She's encouraged to drink more fluids. She had thyroid function tests sent because of her extreme lack of energy that she states makes her needs her energy drinks. She can have her doctor get the results next week.   Results for orders placed during the hospital encounter of 01/04/13  CBC WITH DIFFERENTIAL      Result Value Range    WBC 7.0  4.0 - 10.5 K/uL   RBC 4.33  3.87 - 5.11 MIL/uL   Hemoglobin 13.2  12.0 - 15.0 g/dL   HCT 16.1  09.6 - 04.5 %   MCV 88.9  78.0 - 100.0 fL   MCH 30.5  26.0 - 34.0 pg   MCHC 34.3  30.0 - 36.0 g/dL   RDW 40.9  81.1 - 91.4 %   Platelets 214  150 - 400 K/uL   Neutrophils Relative %  54  43 - 77 %   Neutro Abs 3.8  1.7 - 7.7 K/uL   Lymphocytes Relative 37  12 - 46 %   Lymphs Abs 2.6  0.7 - 4.0 K/uL   Monocytes Relative 6  3 - 12 %   Monocytes Absolute 0.4  0.1 - 1.0 K/uL   Eosinophils Relative 2  0 - 5 %   Eosinophils Absolute 0.1  0.0 - 0.7 K/uL   Basophils Relative 0  0 - 1 %   Basophils Absolute 0.0  0.0 - 0.1 K/uL  BASIC METABOLIC PANEL      Result Value Range   Sodium 140  135 - 145 mEq/L   Potassium 3.4 (*) 3.5 - 5.1 mEq/L   Chloride 101  96 - 112 mEq/L   CO2 26  19 - 32 mEq/L   Glucose, Bld 128 (*) 70 - 99 mg/dL   BUN 9  6 - 23 mg/dL   Creatinine, Ser 1.61  0.50 - 1.10 mg/dL   Calcium 09.6  8.4 - 04.5 mg/dL   GFR calc non Af Amer >90  >90 mL/min   GFR calc Af Amer >90  >90 mL/min  URINALYSIS, ROUTINE W REFLEX MICROSCOPIC      Result Value Range   Color, Urine YELLOW  YELLOW   APPearance CLEAR  CLEAR   Specific Gravity, Urine 1.015  1.005 - 1.030   pH 6.0  5.0 - 8.0   Glucose, UA NEGATIVE  NEGATIVE mg/dL   Hgb urine dipstick TRACE (*) NEGATIVE   Bilirubin Urine NEGATIVE  NEGATIVE   Ketones, ur NEGATIVE  NEGATIVE mg/dL   Protein, ur NEGATIVE  NEGATIVE mg/dL   Urobilinogen, UA 0.2  0.0 - 1.0 mg/dL   Nitrite NEGATIVE  NEGATIVE   Leukocytes, UA TRACE (*) NEGATIVE  URINE MICROSCOPIC-ADD ON      Result Value Range   Squamous Epithelial / LPF RARE  RARE   WBC, UA 0-2  <3 WBC/hpf   Bacteria, UA RARE  RARE  TROPONIN I      Result Value Range   Troponin I <0.30  <0.30 ng/mL   Laboratory interpretation all normal except mild hypokalemia       Date: 01/04/2013  Rate: 105  Rhythm: sinus tachycardia  QRS Axis: normal  Intervals: normal  ST/T Wave  abnormalities: normal  Conduction Disutrbances:none  Narrative Interpretation:   Old EKG Reviewed: unchanged from 11/30/2012 HR was 111     1. Near syncope   2. Hypotension   3. Tachycardia     Plan discharge  Devoria Albe, MD, FACEP   MDM    Ward Givens, MD 01/04/13 [phone]

## 2013-01-18 ENCOUNTER — Ambulatory Visit (HOSPITAL_COMMUNITY)
Admission: RE | Admit: 2013-01-18 | Discharge: 2013-01-18 | Disposition: A | Discharge status: Home / Self Care | Payer: BC Managed Care – PPO | Source: Ambulatory Visit | Attending: Family Medicine | Admitting: Family Medicine

## 2013-01-18 ENCOUNTER — Encounter: Payer: Self-pay | Admitting: Family Medicine

## 2013-01-18 ENCOUNTER — Ambulatory Visit (INDEPENDENT_AMBULATORY_CARE_PROVIDER_SITE_OTHER): Payer: BC Managed Care – PPO | Admitting: Family Medicine

## 2013-01-18 VITALS — BP 104/70 | HR 123 | Temp 97.6°F | Wt 156.4 lb

## 2013-01-18 DIAGNOSIS — R42 Dizziness and giddiness: Secondary | ICD-10-CM | POA: Insufficient documentation

## 2013-01-18 DIAGNOSIS — R000 Tachycardia, unspecified: Secondary | ICD-10-CM

## 2013-01-18 DIAGNOSIS — R55 Syncope and collapse: Secondary | ICD-10-CM | POA: Insufficient documentation

## 2013-01-18 DIAGNOSIS — I959 Hypotension, unspecified: Secondary | ICD-10-CM | POA: Insufficient documentation

## 2013-01-18 DIAGNOSIS — I519 Heart disease, unspecified: Secondary | ICD-10-CM

## 2013-01-18 DIAGNOSIS — Z09 Encounter for follow-up examination after completed treatment for conditions other than malignant neoplasm: Secondary | ICD-10-CM | POA: Insufficient documentation

## 2013-01-18 NOTE — Patient Instructions (Signed)
We are going to get an echocardiogram of your heart and repeat your chest x-ray. I want you to see Dr. Claiborne Billings a few days after the echocardiogram so you can discuss the results and plan future follow up. I will call if we need to make any changes.   Thanks, Dr. Durene Cal  P.S. Try to see [place] in 1-2 weeks regardless  Health Maintenance Due  Topic Date Due  . Colonoscopy  09/22/2006  . Pap Smear  10/12/2009

## 2013-01-18 NOTE — Progress Notes (Signed)
*  PRELIMINARY RESULTS* Echocardiogram 2D Echocardiogram has been performed.  Kelly Espinoza 01/18/2013, 4:07 PM

## 2013-01-18 NOTE — Progress Notes (Signed)
[facility] Tana Conch, MD Phone: [phone]  Subjective:  Same Day appointment  # Palpitations and near-syncope and low blood pressure Patient states history started in late May when she was diagnosed with a  Pneumonia (for which she completed treatment). Since that time she reports intermittent sicknesses including being diagnosed with a sinus infection in June (for which she completed treatment). She has had a low appetite during this time although no difficulty taking fluids in. Congestion and SOB have resolved but symptoms below have persisted throughout  She describes intermittent tachycardia over the last 3 months particularly when standing and walking. States feels heart racing and "blood pressure dropping" which to her is feeling lightheaded and dizzy and like she is going to pass out. Had an event like this today when walking into the office. Has been very tired due to this and states she tried energy drinks for some time which did not help. Denies syncope or LOC. No evidence of shaking of extremities or loss of bowel or bladder. Has not been on a cardiac monitor. Patient was having rapid heart beat when had EKG today. No recent travel, no shortness of breath, no diaphoresis. States blood pressure will drop as low as 80/40 at home when she feels this way. Had to get 3 L IVF in ED on 7/2. No improvement in symptoms since coming off energy drinks. Does endorse sleeping on at least 2 pillows over last few months with no lower extremity swelling. No new medications-has been on seroquel for 2 years. Recently on allegra-D, which she was told to stop and has not had improvement.   Has had mild left sided chest pain intermittently for at least 6 months. Non exertional, not relieved by rest. Hurts when she presses on chest. No history reflux, not related to meals.   Review of records: 11/30/12-treated for community acquired pneumonia at urgent care. CXR suggested follow  up due to some atypical features 12/31/12 treated for sinusitis at urgent care 01/04/13 seen in ED for presyncope. Negative troponin, TSH wnl, T4 wnl. CBC and BMET wnl. 3 L IVF with no improvement in sinus tachycardia thoughs he was asymptomatic on a stretcher.    ROS--See HPI  Past Medical History-No history of cardiac issues (no HTN, HLD, diabetes, smoking history). History of depression.  Reviewed problem list.  Medications- reviewed and updated Chief complaint-noted  Objective: BP 104/70  Pulse 123  Temp(Src) 97.6 F (36.4 C) (Oral)  Wt 156 lb 6.4 oz (70.943 kg)  BMI 25.62 kg/m2 Gen: NAD, resting comfortably on table, lightheaded with standing and HR elevated to 120s when walked in improved to 80s or 90s when resting CV: RRR no murmurs rubs or gallops (after resting), no JVD.  Lungs: CTAB no crackles, wheeze, rhonchi Skin: warm, dry Neuro: grossly normal, moves all extremities Ext: no edema, no calf tenderness  ECG REPORT  Date: 01/18/2013  EKG Time: 1:53 PM  Rate: 102  Rhythm: sinus tachycardia  Axis: normal  Intervals:normal  ST&T Change: none and no q waves   Narrative Interpretation: sinus tachycardia, otherwise normal EKG  Dg Chest 2 View  01/18/2013   *RADIOLOGY REPORT*  Clinical Data: Follow up pneumonia  CHEST - 2 VIEW  Comparison: 11/30/2012  Findings: Cardiomediastinal silhouette is stable.  Mild elevation of the left hemidiaphragm again noted.  Mild thoracic dextroscoliosis.  No acute infiltrate or pulmonary edema.  Bony thorax is stable.  IMPRESSION: No active disease.  Mild elevation of the left hemidiaphragm again  noted.  Mild thoracic dextroscoliosis.   Original Report Authenticated By: Natasha Mead, M.D.   Assessment/Plan:

## 2013-01-18 NOTE — Assessment & Plan Note (Signed)
Unclear etiology. Normal TSH, CBC, BMET, troponin within a week. Sinus tachycardia noted on EKG and otherwise unremarkable when patient having one of these episodes of dizziness. Will obtain echocardiogram to look for structural abnormalities. Tachycardia noted since January-doubt this would be a chronic PE given o2 sats 98% when I checked patient. Repeat CXR due to some atypical features showed resolution of area from May noted during community acquired pneumonia (still with mildly elevated left diaphragm). BP stable in office and has already had orthostatics that were unchanged with fluid resuscitation within a week.   Patient to follow up in 1 week with PCP or me. Would consider cardiology referral depending on echo results.

## 2013-01-20 ENCOUNTER — Telehealth: Payer: Self-pay | Admitting: Family Medicine

## 2013-01-20 NOTE — Telephone Encounter (Signed)
LVM that we have message with nursing staff.   Message for patient: The echocardiogram did not reveal a cause for her lightheadedness. Her valves were normal and pumping function was normal. Would like for patient to schedule follow up with Dr. Claiborne Billings in next week to follow up on recurrent issues.   For Dr. Claiborne Billings but not for patient: Grade I diastolic dysfunction was noted but I do not think this would account for symptoms and would rather you discuss this and the significance (likely little to none) in office than have it communicated by phone.

## 2013-01-23 NOTE — Telephone Encounter (Signed)
Pt has two appts with [facility] on 02/06/13.  Will forward to Front office to cancel one of the appts. [place], Kelly Espinoza

## 2013-01-24 ENCOUNTER — Telehealth: Payer: Self-pay | Admitting: Family Medicine

## 2013-01-24 NOTE — Telephone Encounter (Signed)
Patient is calling for the results of the blood work that was done at [facility].

## 2013-01-26 NOTE — Telephone Encounter (Signed)
I have not been forwarded nor do I see lab work from [facility]. I suggest she call the performing doctor. However, it appears she is to schedule an appt with me to discuss her Echo findings ordered by Dr. Zonia Kief.

## 2013-01-27 NOTE — Telephone Encounter (Signed)
LM on unidentifiable voice mail for patient to call her doctors office.  Please tell patient she needs to call [facility] and get lab results.  She may obtain a copy and bring to her follow up appt with Dr. Claiborne Billings on 02/06/2013 and she will discuss with patient then.  See Dr. Alan Ripper message below.    Lon Klippel, Darlyne Russian, CMA

## 2013-02-01 ENCOUNTER — Ambulatory Visit: Payer: BC Managed Care – PPO | Admitting: Family Medicine

## 2013-02-06 ENCOUNTER — Encounter: Payer: Self-pay | Admitting: Family Medicine

## 2013-02-06 ENCOUNTER — Ambulatory Visit (INDEPENDENT_AMBULATORY_CARE_PROVIDER_SITE_OTHER): Payer: BC Managed Care – PPO | Admitting: Family Medicine

## 2013-02-06 ENCOUNTER — Ambulatory Visit (HOSPITAL_COMMUNITY)
Admission: RE | Admit: 2013-02-06 | Discharge: 2013-02-06 | Disposition: A | Discharge status: Home / Self Care | Payer: BC Managed Care – PPO | Source: Ambulatory Visit | Attending: Family Medicine | Admitting: Family Medicine

## 2013-02-06 ENCOUNTER — Ambulatory Visit: Payer: BC Managed Care – PPO | Admitting: Family Medicine

## 2013-02-06 VITALS — BP 122/61 | HR 110 | Temp 98.6°F | Ht 65.5 in | Wt 156.0 lb

## 2013-02-06 DIAGNOSIS — F329 Major depressive disorder, single episode, unspecified: Secondary | ICD-10-CM

## 2013-02-06 DIAGNOSIS — Z8701 Personal history of pneumonia (recurrent): Secondary | ICD-10-CM | POA: Insufficient documentation

## 2013-02-06 DIAGNOSIS — R55 Syncope and collapse: Secondary | ICD-10-CM

## 2013-02-06 DIAGNOSIS — F3289 Other specified depressive episodes: Secondary | ICD-10-CM

## 2013-02-06 NOTE — Progress Notes (Signed)
Subjective:     Patient ID: Kelly Espinoza, female   DOB: Jun 11, 1956, 57 y.o.   MRN: [id number]  HPI Tachycardia: Kelly Espinoza is here today for follow-up to her echo results. She is still tachycardiac here today (mildly at 110, then 106 with repeat). She reports use of caffeine and 5 hour energy drinks. She states she has been cutting down on both, and rarely drinking a "full" energy drink. She did take a "sip" of one prior to her appointment today. She reports mild intermittent chest pain in the center of her chest that is quick and sharp when it occurs. She has recently also been seen at [facility] for similar complaints. She states at that time her BP was low and her heart rate was fast, they gave her some IV fluids and sent her home. She denies chest pain that radiates, numbness or tingling in her arms, syncope or palpitations. She denies shortness of breath since her pneumonia has been resolved.   Results review: reviewed all her labs, echo and EKG from prior episode. Reviewed CXR results, including finding that was suggested 4 weeks f/u xray. "Focal posterior left lower lobe opacity with associated elevation  of the left hemidiaphragm suggesting an element of volume loss."   Drowsiness/fatigue: Patient has been on Seroquel for depression. She is taking it BID 200 mg. She states she uses the 5 hour energy drinks to stay awake, because if not she will sleep 14-16 hours of her day. She feels her depression is well controlled on Seroquel and has tried many medications in the past without satisfaction.    Review of Systems Negative, with the exception of above mentioned in HPI     Objective:   Physical Exam BP 122/61  Pulse 110  Temp(Src) 98.6 F (37 C) (Oral)  Ht 5' 5.5" (1.664 m)  Wt 156 lb (70.761 kg)  BMI 25.56 kg/m2  SpO2 96% Repeat Pulse: 106 Gen: NAD.  CV: Tachycardic (mildly). No murmurs noted. Regular rhythm.  Chest: CTAB, no wheeze or crackles Ext: No erythema. No edema.   Neuro: Alert. Grossly intact.  Psych: appropriate  dress, affect and deamnor. Speech normal.    Dg Chest 2 View  02/06/2013   *RADIOLOGY REPORT*  Clinical Data: History of pneumonia.  CHEST - 2 VIEW  Comparison: January 18, 2013  Findings: There is no focal infiltrate, pulmonary edema, or pleural effusion.  The mediastinal contour and cardiac silhouette are normal.  Previously noted mild elevation left hemidiaphragm is unchanged.  The soft tissues and osseous structures are stable.  IMPRESSION: No acute cardiopulmonary disease identified.   Original Report Authenticated By: Sherian Rein, M.D.

## 2013-02-06 NOTE — Patient Instructions (Signed)
It was nice to see you today. I have ordered a repeat chest xray for you. I will call you once I get results. I want you to take 300mg  of Seroquel at bedtime (Between 6pm and 9pm). I will want to follow up with you closely on this change. I also want you to stop the energy drinks, caffeine and drink at least 9 glasses of water a day. I will want to recheck your heart rate when you come back for your follow up appointment in 2 weeks.

## 2013-02-06 NOTE — Assessment & Plan Note (Signed)
-   Changed Seroquel dose to 300 mg ( between 6pm and 9pm)  - Hopefully this will decrease her drowsiness and need for energy drinks. - F/U: 2 weeks

## 2013-02-06 NOTE — Assessment & Plan Note (Signed)
-   reviewed all labs and cardiac work-up with patient. Patient is still with mild tachycardia (although improved).  - Advised patient to quit energy drink use and caffeine, along with hydrating at least 8 glasses of water a day.  - Made mild changes to her depression medications to help with sleepiness during the day, and need for energy drinks.  - Cardiac work up, labs and cxr do not reveal an obvious reason for near syncope (x1). Dehydration may have been a issue since it has seemed to improve/resolve with IV fluids in the ED - After changes implemented I will want to see patient back in 2 weeks, to see if she is feeling better and HR becomes normal after energy drinks are no longer a variable. At that time if she is still tachycardiac, I may send her for a cardiac referral or event monitor.

## 2013-02-10 ENCOUNTER — Other Ambulatory Visit: Payer: Self-pay | Admitting: Family Medicine

## 2013-02-10 ENCOUNTER — Other Ambulatory Visit: Payer: Self-pay | Admitting: *Deleted

## 2013-02-10 MED ORDER — QUETIAPINE FUMARATE 100 MG PO TABS: 300.0000 mg | ORAL_TABLET | Freq: Every day | ORAL | Status: DC

## 2013-02-21 ENCOUNTER — Encounter: Payer: Self-pay | Admitting: Family Medicine

## 2013-02-21 ENCOUNTER — Ambulatory Visit (INDEPENDENT_AMBULATORY_CARE_PROVIDER_SITE_OTHER): Payer: BC Managed Care – PPO | Admitting: Family Medicine

## 2013-02-21 VITALS — BP 107/59 | HR 103 | Temp 99.6°F | Ht 65.5 in | Wt 162.0 lb

## 2013-02-21 DIAGNOSIS — R55 Syncope and collapse: Secondary | ICD-10-CM

## 2013-02-21 NOTE — Patient Instructions (Signed)
Nonspecific Tachycardia Tachycardia is a faster than normal heartbeat (more than 100 beats per minute). In adults, the heart normally beats between 60 and 100 times a minute. A fast heartbeat may be a normal response to exercise or stress. It does not necessarily mean that something is wrong. However, sometimes when your heart beats too fast it may not be able to pump enough blood to the rest of your body. This can result in chest pain, shortness of breath, dizziness, and even fainting. Nonspecific tachycardia means that the specific cause or pattern of your tachycardia is unknown. CAUSES  Tachycardia may be harmless or it may be due to a more serious underlying cause. Possible causes of tachycardia include:  Exercise or exertion.  Fever.  Pain or injury.  Infection.  Loss of body fluids (dehydration).  Overactive thyroid.  Lack of red blood cells (anemia).  Anxiety and stress.  Alcohol.  Caffeine.  Tobacco products.  Diet pills.  Illegal drugs.  Heart disease. SYMPTOMS  Rapid or irregular heartbeat (palpitations).  Suddenly feeling your heart beating (cardiac awareness).  Dizziness.  Tiredness (fatigue).  Shortness of breath.  Chest pain.  Nausea.  Fainting. DIAGNOSIS  Your caregiver will perform a physical exam and take your medical history. In some cases, a heart specialist (cardiologist) may be consulted. Your caregiver may also order:  Blood tests.  Electrocardiography. This test records the electrical activity of your heart.  A heart monitoring test. TREATMENT  Treatment will depend on the likely cause of your tachycardia. The goal is to treat the underlying cause of your tachycardia. Treatment methods may include:  Replacement of fluids or blood through an intravenous (IV) tube for moderate to severe dehydration or anemia.  New medicines or changes in your current medicines.  Diet and lifestyle changes.  Treatment for certain  infections.  Stress relief or relaxation methods. HOME CARE INSTRUCTIONS   Rest.  Drink enough fluids to keep your urine clear or pale yellow.  Do not smoke.  Avoid:  Caffeine.  Tobacco.  Alcohol.  Chocolate.  Stimulants such as over-the-counter diet pills or pills that help you stay awake.  Situations that cause anxiety or stress.  Illegal drugs such as marijuana, phencyclidine (PCP), and cocaine.  Only take medicine as directed by your caregiver.  Keep all follow-up appointments as directed by your caregiver. SEEK IMMEDIATE MEDICAL CARE IF:   You have pain in your chest, upper arms, jaw, or neck.  You become weak, dizzy, or feel faint.  You have palpitations that will not go away.  You vomit, have diarrhea, or pass blood in your stool.  Your skin is cool, pale, and wet.  You have a fever that will not go away with rest, fluids, and medicine. MAKE SURE YOU:   Understand these instructions.  Will watch your condition.  Will get help right away if you are not doing well or get worse. Document Released: 07/30/2004 Document Revised: 09/14/2011 Document Reviewed: 06/02/2011 [facility] Patient Information 2014 [place], [place].   I have placed a referral to cardiology for you. They will call you with appointment. They may have you wear a event monitor for a day or more.  I am glad you are feeling better and your symptoms have improved. However, I am not comfortable with yor heart rate remaining high with the changes we made.  Continue to drink water and stay away from stimulants/caffeine.  Any problems please call immediately.

## 2013-02-22 ENCOUNTER — Encounter: Payer: Self-pay | Admitting: Cardiology

## 2013-02-22 ENCOUNTER — Ambulatory Visit (INDEPENDENT_AMBULATORY_CARE_PROVIDER_SITE_OTHER): Payer: BC Managed Care – PPO | Admitting: Cardiology

## 2013-02-22 ENCOUNTER — Encounter: Payer: Self-pay | Admitting: Family Medicine

## 2013-02-22 VITALS — BP 103/76 | HR 106 | Ht 64.5 in | Wt 161.6 lb

## 2013-02-22 DIAGNOSIS — R55 Syncope and collapse: Secondary | ICD-10-CM

## 2013-02-22 DIAGNOSIS — R000 Tachycardia, unspecified: Secondary | ICD-10-CM

## 2013-02-22 NOTE — Progress Notes (Signed)
HPI The patient presents for evaluation of tachycardia. She has had no significant prior cardiac history. However, she's been noted to consistently have elevated heart rates with sinus tachycardia. As I review the records heart racing occasionally been in the 120s. I reviewed some records from July when she was in the emergency room. She apparently was dehydrated and treated with volume. She was tachycardic at that time. She went home and stop drinking the energy drinks that she was using started eating better and hydrating. Symptoms of dizziness and presyncope that she had complained about resolved. She had been having some orthostatic symptoms but she's no longer having this. She actually says she feels well. She's not overly active but occasionally does some active work and recently when she's done that she's had no problems. The patient denies any new symptoms such as chest discomfort, neck or arm discomfort. There has been no new shortness of breath, PND or orthopnea. There have been no reported palpitations, presyncope or syncope.  However, she has continued to have an increased heart rate.  Of note she did have an echo recently that was normal.     Allergies  Allergen Reactions  . Amitriptyline Other (See Comments)    Loss of muscle control  . Codeine Diarrhea and Nausea And Vomiting  . Darvocet [Propoxyphene-Acetaminophen] Nausea And Vomiting  . Donnatal [Belladonna Alk-Phenobarb Er] Other (See Comments)    Loss of muscle control  . Hydrocodone Diarrhea and Nausea And Vomiting  . Sulfonamide Derivatives Swelling  . Tramadol Other (See Comments)    Unknown     Current Outpatient Prescriptions  Medication Sig Dispense Refill  . albuterol (PROVENTIL HFA;VENTOLIN HFA) 108 (90 BASE) MCG/ACT inhaler Inhale 2 puffs into the lungs every 4 (four) hours as needed for wheezing.  1 Inhaler  0  . cetirizine (ZYRTEC) 10 MG tablet Take 10 mg by mouth daily.      . QUEtiapine (SEROQUEL) 100 MG  tablet Take 3 tablets (300 mg total) by mouth at bedtime.  120 tablet  15   No current facility-administered medications for this visit.    Past Medical History  Diagnosis Date  . Depression   . Congestion of nasal sinus     chronic  . IBS (irritable bowel syndrome)   . Hypoglycemia     Past Surgical History  Procedure Laterality Date  . Cesarean section  1991 abd 1993    x 2  . Ear canaloplasty  1987    L ear and tympanoplasty, mastoidectomy.  . Tympanomastoidectomy  2000    R ear  . Cystoscopy  1977  . Tube reversal  2005    reverse tubal ligation  . Joint replacement    . Total hip arthroplasty      Family History  Problem Relation Age of Onset  . Breast cancer Mother   . Parkinson's disease Mother     History   Social History  . Marital Status: Married    Spouse Name: N/A    Number of Children: N/A  . Years of Education: N/A   Occupational History  . Not on file.   Social History Main Topics  . Smoking status: Never Smoker   . Smokeless tobacco: Not on file  . Alcohol Use: No  . Drug Use: No  . Sexual Activity: Yes    Birth Control/ Protection: None   Other Topics Concern  . Not on file   Social History Narrative  . No narrative on file  ROS:  As stated in the HPI and negative for all other systems.  PHYSICAL EXAM BP 114/71  Pulse 101  Ht 5' 4.5" (1.638 m)  Wt 161 lb 9.6 oz (73.301 kg)  BMI 27.32 kg/m2 GENERAL:  Well appearing HEENT:  Pupils equal round and reactive, fundi not visualized, oral mucosa unremarkable NECK:  No jugular venous distention, waveform within normal limits, carotid upstroke brisk and symmetric, no bruits, no thyromegaly LYMPHATICS:  No cervical, inguinal adenopathy LUNGS:  Clear to auscultation bilaterally BACK:  No CVA tenderness CHEST:  Unremarkable HEART:  PMI not displaced or sustained,S1 and S2 within normal limits, no S3, no S4, no clicks, no rubs, no murmurs ABD:  Flat, positive bowel sounds normal in  frequency in pitch, no bruits, no rebound, no guarding, no midline pulsatile mass, no hepatomegaly, no splenomegaly EXT:  2 plus pulses throughout, no edema, no cyanosis no clubbing SKIN:  No rashes no nodules NEURO:  Cranial nerves II through XII grossly intact, motor grossly intact throughout PSYCH:  Cognitively intact, oriented to person place and time   EKG:  Sinus rhythm, rate 97, axis within normal limits, intervals within normal limits, RSR prime V1 and V2, no acute ST T wave changes. 02/22/2013  ASSESSMENT AND PLAN  TACHYCARDIA:  The patient was not orthostatic in the office. She does have sinus tachycardia. I suspect that the more rapid rates were related to the energy drinks and dehydration. I will however followup with a 24-hour Holter to make sure there is not some more sustained dysrhythmia. The patient otherwise has no symptoms and would not require further treatment.  Further management will be based on the results of this monitor.

## 2013-02-22 NOTE — Patient Instructions (Addendum)
Your physician has recommended that you wear a holter monitor. Holter monitors are medical devices that record the heart's electrical activity. Doctors most often use these monitors to diagnose arrhythmias. Arrhythmias are problems with the speed or rhythm of the heartbeat. The monitor is a small, portable device. You can wear one while you do your normal daily activities. This is usually used to diagnose what is causing palpitations/syncope (passing out).  FOLLOW UP AS NEEDED

## 2013-02-22 NOTE — Assessment & Plan Note (Signed)
-   Patient has made appropriate changes. She is still mildly tachy at rest with symptomatic reports of heart racing with minor duties.  - Advised her to stay off stimulants of all kinds. - I have placed a cardiology referral for her today to get a full cardiac work-up and event monitoring for her persistent ST, with h/o syncope.  - Would like to f/u with her after cardio appt/event monitor.

## 2013-02-22 NOTE — Progress Notes (Signed)
Subjective:     Patient ID: Kelly Espinoza, female   DOB: [birth year], 56 y.o.   MRN: [id number]  HPI Tachycardia: Patient with near syncopal episode, essentially normal cardiac workup thus far with minor ECHO changes and ST on EKG. However, she has persistent tachycardia at rest. She has been without caffeine/stimulants since out last appointment. She states she felt her heart race on occasions after performing minor household duties and eating chocolate as well. She is still feeling tired during the day, but better. Her sleep schedule has improved with the changes in Seroquel.  She denies chest pain, shortness of breath, palpitations, LE edema or syncope.   Review of Systems Negative, with the exception of above mentioned in HPI    Objective:   Physical Exam BP 107/59  Pulse 103  Temp(Src) 99.6 F (37.6 C) (Oral)  Ht 5' 5.5" (1.664 m)  Wt 162 lb (73.483 kg)  BMI 26.54 kg/m2 Gen: NAD. Looks well. Yawns frequently.  CV: Mildly tachycardic. Regular rhythm.  Chest: CTAB, no wheeze or crackles Ext: No erythema. No edema.  Psych: appropriate dress, affect and deamnor. Normal speech. No signs of depression. Sometimes anxious.

## 2013-02-24 ENCOUNTER — Ambulatory Visit (INDEPENDENT_AMBULATORY_CARE_PROVIDER_SITE_OTHER): Payer: BC Managed Care – PPO

## 2013-02-24 DIAGNOSIS — R55 Syncope and collapse: Secondary | ICD-10-CM

## 2013-02-24 DIAGNOSIS — R000 Tachycardia, unspecified: Secondary | ICD-10-CM

## 2013-02-24 NOTE — Progress Notes (Signed)
PLACED A ECARDIO 24 HR HOLTER

## 2013-04-03 ENCOUNTER — Telehealth: Payer: Self-pay | Admitting: Family Medicine

## 2013-04-03 NOTE — Telephone Encounter (Signed)
Pt has jury duty starting on Monday Oct 6. She would like for a letter to be sent excusing her from jury duty. Fax number: [phone] [facility] Please advise

## 2013-04-03 NOTE — Telephone Encounter (Signed)
Please ask for specifics on reasoning behind wanting excused from [place] duty. Thanks.

## 2013-04-04 NOTE — Telephone Encounter (Signed)
Patient states because she take seroquel. Kelly Espinoza, [name]

## 2013-04-05 ENCOUNTER — Encounter: Payer: Self-pay | Admitting: Family Medicine

## 2013-04-07 NOTE — Telephone Encounter (Signed)
Spoke with patient and informed her of below 

## 2013-04-07 NOTE — Telephone Encounter (Signed)
I have spoke this over with a preceptor and it is not felt that her use of seroquel would cause her not to be able sit for a jury. Thus, our office will not write a letter for her to be excused. Please inform her. Thanks.

## 2013-07-19 ENCOUNTER — Other Ambulatory Visit: Payer: Self-pay | Admitting: Orthopedic Surgery

## 2013-07-19 DIAGNOSIS — M25552 Pain in left hip: Secondary | ICD-10-CM

## 2013-07-26 ENCOUNTER — Other Ambulatory Visit: Payer: BC Managed Care – PPO

## 2013-08-02 ENCOUNTER — Ambulatory Visit
Admission: RE | Admit: 2013-08-02 | Discharge: 2013-08-02 | Disposition: A | Discharge status: Home / Self Care | Payer: PRIVATE HEALTH INSURANCE | Source: Ambulatory Visit | Attending: Orthopedic Surgery | Admitting: Orthopedic Surgery

## 2013-08-02 DIAGNOSIS — M25552 Pain in left hip: Secondary | ICD-10-CM

## 2013-11-27 ENCOUNTER — Encounter (HOSPITAL_COMMUNITY): Payer: Self-pay | Admitting: Emergency Medicine

## 2013-11-27 ENCOUNTER — Emergency Department (HOSPITAL_COMMUNITY)
Admission: EM | Admit: 2013-11-27 | Discharge: 2013-11-27 | Disposition: A | Discharge status: Home / Self Care | Payer: 59 | Source: Home / Self Care | Attending: Family Medicine | Admitting: Family Medicine

## 2013-11-27 ENCOUNTER — Telehealth: Payer: Self-pay | Admitting: Family Medicine

## 2013-11-27 DIAGNOSIS — N390 Urinary tract infection, site not specified: Secondary | ICD-10-CM

## 2013-11-27 LAB — POCT URINALYSIS DIP (DEVICE)
BILIRUBIN URINE: NEGATIVE
GLUCOSE, UA: NEGATIVE mg/dL
Ketones, ur: NEGATIVE mg/dL
NITRITE: NEGATIVE
Protein, ur: NEGATIVE mg/dL
Specific Gravity, Urine: 1.025 (ref 1.005–1.030)
UROBILINOGEN UA: 0.2 mg/dL (ref 0.0–1.0)
pH: 7 (ref 5.0–8.0)

## 2013-11-27 MED ORDER — NITROFURANTOIN MONOHYD MACRO 100 MG PO CAPS: 100.0000 mg | ORAL_CAPSULE | Freq: Two times a day (BID) | ORAL | Status: DC

## 2013-11-27 NOTE — Discharge Instructions (Signed)
Urinary Tract Infection  Urinary tract infections (UTIs) can develop anywhere along your urinary tract. Your urinary tract is your body's drainage system for removing wastes and extra water. Your urinary tract includes two kidneys, two ureters, a bladder, and a urethra. Your kidneys are a pair of bean-shaped organs. Each kidney is about the size of your fist. They are located below your ribs, one on each side of your spine.  CAUSES  Infections are caused by microbes, which are microscopic organisms, including fungi, viruses, and bacteria. These organisms are so small that they can only be seen through a microscope. Bacteria are the microbes that most commonly cause UTIs.  SYMPTOMS   Symptoms of UTIs may vary by age and gender of the patient and by the location of the infection. Symptoms in young women typically include a frequent and intense urge to urinate and a painful, burning feeling in the bladder or urethra during urination. Older women and men are more likely to be tired, shaky, and weak and have muscle aches and abdominal pain. A fever may mean the infection is in your kidneys. Other symptoms of a kidney infection include pain in your back or sides below the ribs, nausea, and vomiting.  DIAGNOSIS  To diagnose a UTI, your caregiver will ask you about your symptoms. Your caregiver also will ask to provide a urine sample. The urine sample will be tested for bacteria and white blood cells. White blood cells are made by your body to help fight infection.  TREATMENT   Typically, UTIs can be treated with medication. Because most UTIs are caused by a bacterial infection, they usually can be treated with the use of antibiotics. The choice of antibiotic and length of treatment depend on your symptoms and the type of bacteria causing your infection.  HOME CARE INSTRUCTIONS   If you were prescribed antibiotics, take them exactly as your caregiver instructs you. Finish the medication even if you feel better after you  have only taken some of the medication.   Drink enough water and fluids to keep your urine clear or pale yellow.   Avoid caffeine, tea, and carbonated beverages. They tend to irritate your bladder.   Empty your bladder often. Avoid holding urine for long periods of time.   Empty your bladder before and after sexual intercourse.   After a bowel movement, women should cleanse from front to back. Use each tissue only once.  SEEK MEDICAL CARE IF:    You have back pain.   You develop a fever.   Your symptoms do not begin to resolve within 3 days.  SEEK IMMEDIATE MEDICAL CARE IF:    You have severe back pain or lower abdominal pain.   You develop chills.   You have nausea or vomiting.   You have continued burning or discomfort with urination.  MAKE SURE YOU:    Understand these instructions.   Will watch your condition.   Will get help right away if you are not doing well or get worse.  Document Released: 04/01/2005 Document Revised: 12/22/2011 Document Reviewed: 07/31/2011  [employer] Patient Information 2014 [employer].

## 2013-11-27 NOTE — ED Notes (Signed)
Pt  Reports  Symptoms  Of  Frequent  Urination  With  Slight  Back  Pain   Slight  Discoloration  With  Symptoms      X  3  Days

## 2013-11-27 NOTE — ED Provider Notes (Signed)
CSN: [id number]     Arrival date & time 11/27/13  1541 History   First MD Initiated Contact with Patient 11/27/13 1657     Chief Complaint  Patient presents with  . Dysuria   (Consider location/radiation/quality/duration/timing/severity/associated sxs/prior Treatment) HPI Comments: Patient reports 4 days of frequent urination, malaise, mild lower back pain without fever, N/V, flank pain, dysuria, or hematuria.   The history is provided by the patient.    Past Medical History  Diagnosis Date  . Depression   . Congestion of nasal sinus     chronic  . IBS (irritable bowel syndrome)   . Hypoglycemia    Past Surgical History  Procedure Laterality Date  . Cesarean section  1991 abd 1993    x 2  . Ear canaloplasty  1987    L ear and tympanoplasty, mastoidectomy.  . Tympanomastoidectomy  2000    R ear  . Cystoscopy  1977  . Tube reversal  2005    reverse tubal ligation  . Total hip arthroplasty      Left   Family History  Problem Relation Age of Onset  . Breast cancer Mother   . Parkinson's disease Mother    History  Substance Use Topics  . Smoking status: Never Smoker   . Smokeless tobacco: Not on file  . Alcohol Use: No   OB History   Grav Para Term Preterm Abortions TAB SAB Ect Mult Living                 Review of Systems  Constitutional: Negative.   HENT: Negative.   Respiratory: Negative.   Cardiovascular: Negative.   Gastrointestinal: Negative for nausea, vomiting, abdominal pain, diarrhea, constipation and blood in stool.  Genitourinary: Positive for frequency. Negative for dysuria, urgency, hematuria, flank pain, vaginal bleeding, vaginal discharge, genital sores, vaginal pain and pelvic pain.  Musculoskeletal: Positive for back pain.  Skin: Negative.   Neurological: Positive for light-headedness. Negative for dizziness, syncope, weakness and headaches.  Hematological: Negative for adenopathy.    Allergies  Amitriptyline; Codeine; Darvocet; Donnatal;  Hydrocodone; Sulfonamide derivatives; and Tramadol  Home Medications   Prior to Admission medications   Medication Sig Start Date End Date Taking? Authorizing Provider  albuterol (PROVENTIL HFA;VENTOLIN HFA) 108 (90 BASE) MCG/ACT inhaler Inhale 2 puffs into the lungs every 4 (four) hours as needed for wheezing. 12/31/12   Janne Napoleon, NP  cetirizine (ZYRTEC) 10 MG tablet Take 10 mg by mouth daily.    Historical Provider, MD  QUEtiapine (SEROQUEL) 100 MG tablet Take 3 tablets (300 mg total) by mouth at bedtime. 02/10/13   Renee A Kuneff, DO   There were no vitals taken for this visit. Physical Exam  Nursing note and vitals reviewed. Constitutional: She is oriented to person, place, and time. She appears well-developed and well-nourished. No distress.  HENT:  Head: Normocephalic and atraumatic.  Eyes: Conjunctivae are normal. No scleral icterus.  Cardiovascular: Normal rate, regular rhythm and normal heart sounds.   Pulmonary/Chest: Effort normal and breath sounds normal. No respiratory distress. She has no wheezes.  Abdominal: Soft. Bowel sounds are normal. She exhibits no distension. There is no tenderness. There is no CVA tenderness.  Musculoskeletal: Normal range of motion.  Neurological: She is alert and oriented to person, place, and time.  Skin: Skin is warm and dry. No rash noted. No erythema.  Psychiatric: She has a normal mood and affect. Her behavior is normal.    ED Course  Procedures (including critical care  time) Labs Review Labs Reviewed  POCT URINALYSIS DIP (DEVICE) - Abnormal; Notable for the following:    Hgb urine dipstick TRACE (*)    Leukocytes, UA LARGE (*)    All other components within normal limits  URINE CULTURE    Imaging Review No results found.   MDM   1. UTI (lower urinary tract infection)    UA suggestive of UTI. Will send for culture and initiate treatment with 5 day course of macrobid. Advise PCP follow up if no improvement.     [place], [place] 11/27/13 [phone]

## 2013-11-27 NOTE — Telephone Encounter (Signed)
Emergency Line / After Hours Call  Pt called the emergency line because she thinks she has a bladder infection. Has had to urinate a lot and feels lightheaded. She is drinking well. Denies chest pain or SOB. Advised that she go to Urgent Care today to be evaluated since our clinic is closed for the holiday. She was not sure if UC is open, so I recommended that if UC is closed she could try to go to another UC or to the ER. Also advised that she not drive herself if she is feeling lightheaded. Pt understood these recommendations.  Chrisandra Netters, MD Family Medicine PGY-2

## 2013-11-28 LAB — URINE CULTURE
Colony Count: >=100000
Special Requests: NORMAL

## 2013-11-28 LAB — GLUCOSE, CAPILLARY: Glucose-Capillary: 103 mg/dL — ABNORMAL HIGH (ref 70–99)

## 2013-11-29 NOTE — ED Provider Notes (Signed)
Medical screening examination/treatment/procedure(s) were performed by a resident physician or non-physician practitioner and as the supervising physician I was immediately available for consultation/collaboration.  Lynne Leader, MD    Gregor Hams, MD 11/29/13 [phone]

## 2013-12-21 ENCOUNTER — Ambulatory Visit (INDEPENDENT_AMBULATORY_CARE_PROVIDER_SITE_OTHER): Payer: 59 | Admitting: Family Medicine

## 2013-12-21 ENCOUNTER — Encounter: Payer: Self-pay | Admitting: Family Medicine

## 2013-12-21 VITALS — BP 117/75 | HR 109 | Temp 98.3°F | Ht 64.5 in | Wt 164.0 lb

## 2013-12-21 DIAGNOSIS — W503XXA Accidental bite by another person, initial encounter: Secondary | ICD-10-CM

## 2013-12-21 DIAGNOSIS — R12 Heartburn: Secondary | ICD-10-CM

## 2013-12-21 DIAGNOSIS — T148XXA Other injury of unspecified body region, initial encounter: Secondary | ICD-10-CM

## 2013-12-21 DIAGNOSIS — M25529 Pain in unspecified elbow: Secondary | ICD-10-CM | POA: Insufficient documentation

## 2013-12-21 MED ORDER — OMEPRAZOLE 20 MG PO CPDR: 20.0000 mg | DELAYED_RELEASE_CAPSULE | Freq: Every day | ORAL | Status: DC
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
## 2013-12-21 NOTE — Patient Instructions (Signed)
You are doing well overall The bite is not infected and is healing well Please use the antibiotic ointment until the middle is completely closed Please use Vitamin E and avoid lots of sun to ensure the bite mark heals quickly Call if it becomes infected Please start the prilosec for 2 weeks Please avoid sleepin on yoru right side. Take ibuprofen 600mg  every 6 hours for the next 3 days

## 2013-12-21 NOTE — Assessment & Plan Note (Signed)
OTC preparations not working Has not trie PPI Trial 2 wks of prilosec

## 2013-12-21 NOTE — Assessment & Plan Note (Signed)
No sign of infection Well healing Continue ABX ointment until central area heals over Call if becomes infected Vit E ointment and avoid direct sun

## 2013-12-21 NOTE — Assessment & Plan Note (Addendum)
Likely MSK type pain from relaxation of shoulder joint and compression by body wt at night. Start daily ROM exercises  Pt to sleep in back, stomach and other side Pt to start ibuprofen 600mg  Q6 as needed

## 2014-03-07 ENCOUNTER — Other Ambulatory Visit: Payer: Self-pay | Admitting: Family Medicine

## 2014-03-07 NOTE — Telephone Encounter (Signed)
PT states she needs this done today, if possible.

## 2014-03-29 ENCOUNTER — Ambulatory Visit: Payer: 59 | Admitting: Family Medicine

## 2014-06-08 ENCOUNTER — Telehealth: Payer: Self-pay | Admitting: Family Medicine

## 2014-06-08 NOTE — Telephone Encounter (Signed)
Emergency Line / After Hours Call  Patient is taking Seroquel and is complaining of severe dry mouth. She last took it last night.  She has been on the medication for at least a year. The dry mouth has been going on several nights.  The medication was decreased about 6 months ago. No urinary retention or loose stools.   Since the dry mouth has been going on for a prolonged period of time and the medication has been taken for several months, will continue the medication.  Symptomatic treatment of the dry mouth.   Clearance Coots, MD Family Medicine PGY-2

## 2014-07-30 ENCOUNTER — Other Ambulatory Visit: Payer: Self-pay | Admitting: Family Medicine

## 2014-07-30 ENCOUNTER — Ambulatory Visit (INDEPENDENT_AMBULATORY_CARE_PROVIDER_SITE_OTHER): Payer: 59 | Admitting: Family Medicine

## 2014-07-30 ENCOUNTER — Encounter: Payer: Self-pay | Admitting: Family Medicine

## 2014-07-30 VITALS — BP 122/59 | HR 114 | Temp 98.1°F | Resp 18 | Wt 171.0 lb

## 2014-07-30 DIAGNOSIS — R5383 Other fatigue: Secondary | ICD-10-CM | POA: Insufficient documentation

## 2014-07-30 DIAGNOSIS — R000 Tachycardia, unspecified: Secondary | ICD-10-CM

## 2014-07-30 DIAGNOSIS — R5382 Chronic fatigue, unspecified: Secondary | ICD-10-CM

## 2014-07-30 DIAGNOSIS — Z0000 Encounter for general adult medical examination without abnormal findings: Secondary | ICD-10-CM

## 2014-07-30 DIAGNOSIS — E669 Obesity, unspecified: Secondary | ICD-10-CM

## 2014-07-30 LAB — COMPREHENSIVE METABOLIC PANEL
ALBUMIN: 4.4 g/dL (ref 3.5–5.2)
ALT: 71 U/L — AB (ref 0–35)
AST: 57 U/L — ABNORMAL HIGH (ref 0–37)
Alkaline Phosphatase: 84 U/L (ref 39–117)
BILIRUBIN TOTAL: 0.6 mg/dL (ref 0.2–1.2)
BUN: 13 mg/dL (ref 6–23)
CALCIUM: 9.3 mg/dL (ref 8.4–10.5)
CHLORIDE: 105 meq/L (ref 96–112)
CO2: 26 mEq/L (ref 19–32)
CREATININE: 0.65 mg/dL (ref 0.50–1.10)
GLUCOSE: 114 mg/dL — AB (ref 70–99)
POTASSIUM: 3.7 meq/L (ref 3.5–5.3)
SODIUM: 141 meq/L (ref 135–145)
Total Protein: 7.2 g/dL (ref 6.0–8.3)

## 2014-07-30 LAB — LIPID PANEL
CHOLESTEROL: 183 mg/dL (ref 0–200)
HDL: 41 mg/dL (ref >39)
LDL Cholesterol: 84 mg/dL (ref 0–99)
Total CHOL/HDL Ratio: 4.5 Ratio
Triglycerides: 292 mg/dL — ABNORMAL HIGH (ref <150)
VLDL: 58 mg/dL — ABNORMAL HIGH (ref 0–40)

## 2014-07-30 LAB — CBC WITH DIFFERENTIAL/PLATELET
BASOS PCT: 0 % (ref 0–1)
Basophils Absolute: 0 10*3/uL (ref 0.0–0.1)
EOS ABS: 0.1 10*3/uL (ref 0.0–0.7)
Eosinophils Relative: 2 % (ref 0–5)
HCT: 40.4 % (ref 36.0–46.0)
Hemoglobin: 14.4 g/dL (ref 12.0–15.0)
LYMPHS PCT: 37 % (ref 12–46)
Lymphs Abs: 2.5 10*3/uL (ref 0.7–4.0)
MCH: 31.9 pg (ref 26.0–34.0)
MCHC: 35.6 g/dL (ref 30.0–36.0)
MCV: 89.4 fL (ref 78.0–100.0)
MONO ABS: 0.5 10*3/uL (ref 0.1–1.0)
MONOS PCT: 7 % (ref 3–12)
MPV: 10 fL (ref 8.6–12.4)
NEUTROS ABS: 3.7 10*3/uL (ref 1.7–7.7)
Neutrophils Relative %: 54 % (ref 43–77)
PLATELETS: 221 10*3/uL (ref 150–400)
RBC: 4.52 MIL/uL (ref 3.87–5.11)
RDW: 13.5 % (ref 11.5–15.5)
WBC: 6.8 10*3/uL (ref 4.0–10.5)

## 2014-07-30 LAB — VITAMIN B12: VITAMIN B 12: 1083 pg/mL — AB (ref 211–911)

## 2014-07-30 LAB — TSH: TSH: 3.031 u[IU]/mL (ref 0.350–4.500)

## 2014-07-30 MED ORDER — METOPROLOL TARTRATE 25 MG PO TABS: 25.0000 mg | ORAL_TABLET | Freq: Two times a day (BID) | ORAL | Status: DC

## 2014-07-30 NOTE — Progress Notes (Signed)
   Subjective:    Patient ID: Kelly Espinoza, female    DOB: Nov 24, 1955, 59 y.o.   MRN: [id number]  HPI   Fatigue: Patient presents to family medicine clinic with complaints of fatigue for over 2 years. She states she sleeps 12-14 hours a day. She is being treated for depression with Seroquel 300 mg daily at bedtime. She states she is really happy where she is out with her depression control, and does not want to change the Seroquel dose. Patient states she has no problem falling asleep. She takes her Seroquel usually about 8 PM, and she is asleep within 30 minutes. She has decent sleep hygiene, she does watch clicks on her computer, but this is not on what she is sleeping. She wakes up only she has to go to the bathroom or has a dry mouth. She easily falls back asleep again. She does not drink caffeine, or if she does it's only very little and a day. She is attempted to take oral vitamins, including boost and insure to help with nutrition causes of fatigue. She does not feel this is effective. 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 He states that she attempts to force herself to wake up, she feels very bad and feels like her heart is racing. She endorses frequent constipation. She endorses feeling her heart rate at times. She denies any syncope, shortness of breath or chest pain.  Health maintenance: Patient is overdue for mammogram, Pap smear and colonoscopy. She declines the flu vaccination.  Nonsmoker  Past Medical History  Diagnosis Date  . Depression   . Congestion of nasal sinus     chronic  . IBS (irritable bowel syndrome)   . Hypoglycemia     Allergies  Allergen Reactions  . Amitriptyline Other (See Comments)    Loss of muscle control  . Codeine Diarrhea and Nausea And Vomiting  . Darvocet [Propoxyphene N-Acetaminophen] Nausea And Vomiting  . Donnatal [Belladonna Alk-Phenobarb Er]  Other (See Comments)    Loss of muscle control  . Hydrocodone Diarrhea and Nausea And Vomiting  . Sulfonamide Derivatives Swelling  . Tramadol Other (See Comments)    Unknown     Review of Systems Per HPI    Objective:   Physical Exam BP 122/59 mmHg  Pulse 114  Temp(Src) 98.1 F (36.7 C) (Oral)  Resp 18  Wt 171 lb (77.565 kg)  SpO2 97% Gen: NAD. The heart appearing, pleasant Caucasian female, no acute distress, nontoxic in appearance well-developed well-nourished, mildly obese. HEENT: AT. [place].Bilateral eyes without injections or icterus. MMM. CV: Tachycardic, regular rhythm, no murmur Chest: CTAB, no wheeze or crackles Abd: Soft. Mildly obese, NTND. BS present. No Masses palpated.  Ext: No erythema. No edema.  Psych: Normal affect, dressed, demeanor and mood. Normal speech     Assessment & Plan:

## 2014-07-30 NOTE — Assessment & Plan Note (Signed)
Patient given contact information and encouraged to make appointment for mammogram, Pap smear and colonoscopy. - She had an irregular mammogram in 2013, that was felt likely benign but 6 month follow-up was indicated. Patient has never gone to have her follow-up mammogram.

## 2014-07-30 NOTE — Assessment & Plan Note (Addendum)
Patient has been tachycardic for a few years, cardiology workup was negative for event monitor. At times it is felt she was tachycardic due to energy drinks, however patient has not been drinking energy drinks or caffeine for some time. - We'll start low-dose metoprolol twice a day - Patient to return to clinic in 1 week for blood pressure and heart rate evaluation by nurse - Four-week follow-up with provider, or sooner if needed

## 2014-07-30 NOTE — Assessment & Plan Note (Signed)
Patient has been chronically fatigued over the last few years. Etiology likely multifactorial versus chronic fatigue syndrome - Patient has long-standing history of tachycardia, without medications. It was believed prior to be from NAD drinks, however she has not been drinking any treatment drinks for some time. Discussed with patient today and will start low-dose metoprolol. Follow-up in one week for nurse visit for BP and heart rate. - We'll do some baseline labs today CBC, CMP, TSH, B-12, vitamin D and lipids. - Patient has been treated for depression, doing rather well. She is currently on Seroquel 300 mg at night, this could be the cause of her sleepiness as well. Advised her if she did not feel mildly better with the addition of metoprolol, in 2-3 weeks I would like her to try to back down to 200 mg of Seroquel at night and take it 7 PM. Patient is hesitant to change her Seroquel dose because she's been doing so well with her depression. We discussed the possibility of trying another medication instead of Seroquel, but she is also hesitant to change the medication. - AVS today on fatigue and chronic fatigue syndrome. - Follow-up in 4 weeks, or sooner if needed. I will call with lab results once they are available

## 2014-07-30 NOTE — Assessment & Plan Note (Signed)
Discussed exercise today would be beneficial for her weight and her depression. 150 minutes of exercise a week was recommended. Lipid panel today, CBC, CMP and TSH

## 2014-07-30 NOTE — Patient Instructions (Addendum)
Fatigue Fatigue is a feeling of tiredness, lack of energy, lack of motivation, or feeling tired all the time. Having enough rest, good nutrition, and reducing stress will normally reduce fatigue. Consult your caregiver if it persists. The nature of your fatigue will help your caregiver to find out its cause. The treatment is based on the cause.  CAUSES  There are many causes for fatigue. Most of the time, fatigue can be traced to one or more of your habits or routines. Most causes fit into one or more of three general areas. They are: Lifestyle problems  Sleep disturbances.  Overwork.  Physical exertion.  Unhealthy habits.  Poor eating habits or eating disorders.  Alcohol and/or drug use .  Lack of proper nutrition (malnutrition). Psychological problems  Stress and/or anxiety problems.  Depression.  Grief.  Boredom. Medical Problems or Conditions  Anemia.  Pregnancy.  Thyroid gland problems.  Recovery from major surgery.  Continuous pain.  Emphysema or asthma that is not well controlled  Allergic conditions.  Diabetes.  Infections (such as mononucleosis).  Obesity.  Sleep disorders, such as sleep apnea.  Heart failure or other heart-related problems.  Cancer.  Kidney disease.  Liver disease.  Effects of certain medicines such as antihistamines, cough and cold remedies, prescription pain medicines, heart and blood pressure medicines, drugs used for treatment of cancer, and some antidepressants. SYMPTOMS  The symptoms of fatigue include:   Lack of energy.  Lack of drive (motivation).  Drowsiness.  Feeling of indifference to the surroundings. DIAGNOSIS  The details of how you feel help guide your caregiver in finding out what is causing the fatigue. You will be asked about your present and past health condition. It is important to review all medicines that you take, including prescription and non-prescription items. A thorough exam will be done.  You will be questioned about your feelings, habits, and normal lifestyle. Your caregiver may suggest blood tests, urine tests, or other tests to look for common medical causes of fatigue.  TREATMENT  Fatigue is treated by correcting the underlying cause. For example, if you have continuous pain or depression, treating these causes will improve how you feel. Similarly, adjusting the dose of certain medicines will help in reducing fatigue.  HOME CARE INSTRUCTIONS   Try to get the required amount of good sleep every night.  Eat a healthy and nutritious diet, and drink enough water throughout the day.  Practice ways of relaxing (including yoga or meditation).  Exercise regularly.  Make plans to change situations that cause stress. Act on those plans so that stresses decrease over time. Keep your work and personal routine reasonable.  Avoid street drugs and minimize use of alcohol.  Start taking a daily multivitamin after consulting your caregiver. SEEK MEDICAL CARE IF:   You have persistent tiredness, which cannot be accounted for.  You have fever.  You have unintentional weight loss.  You have headaches.  You have disturbed sleep throughout the night.  You are feeling sad.  You have constipation.  You have dry skin.  You have gained weight.  You are taking any new or different medicines that you suspect are causing fatigue.  You are unable to sleep at night.  You develop any unusual swelling of your legs or other parts of your body. SEEK IMMEDIATE MEDICAL CARE IF:   You are feeling confused.  Your vision is blurred.  You feel faint or pass out.  You develop severe headache.  You develop severe abdominal, pelvic, or   back pain.  You develop chest pain, shortness of breath, or an irregular or fast heartbeat.  You are unable to pass a normal amount of urine.  You develop abnormal bleeding such as bleeding from the rectum or you vomit blood.  You have thoughts  about harming yourself or committing suicide.  You are worried that you might harm someone else. MAKE SURE YOU:   Understand these instructions.  Will watch your condition.  Will get help right away if you are not doing well or get worse. Document Released: 04/19/2007 Document Revised: 09/14/2011 Document Reviewed: 10/24/2013 [facility] Patient Information 2015 [place], [place]. This information is not intended to replace advice given to you by your health care provider. Make sure you discuss any questions you have with your health care provider.   Chronic Fatigue Syndrome Chronic fatigue syndrome (CFS) is a condition in which there is lasting, extreme tiredness (fatigue) that does not improve with rest. CFS affects women up to four times more often than men. If you have CFS, fatigue and other symptoms can make it hard for you to get through your day. There is no treatment or cure. You will need to work closely with your health care provider to come up with a treatment plan that works for you. CAUSES  No one knows what causes CFS. It may be triggered by a flu-like illness or by mono. Other triggers may include:  An abnormal immune system.  Low blood pressure.  Poor diet.  Physical or emotional stress. SIGNS AND SYMPTOMS The main symptom is fatigue that lasts all day, especially after physical or mental stress. Other common symptoms include:  An extreme loss of energy with no obvious cause.  Muscle or joint soreness.  Severe weakness.  Frequent headaches.  Fever.  Sore throat.  Swollen lymph glands.  Sleep is not refreshing.  Loss of concentration or memory. Less common symptoms may include:  Chills.  Night sweats.  Tingling or numbness.  Blurred vision.  Dizziness.  Sensitivity to noise or odors.  Mood swings.  Anxiety, panic attacks, and depression. Your symptoms may come and go, or you may have them all the time. DIAGNOSIS  There are no tests that can  help health care providers diagnose CFS. It may take a long time for you to get a correct diagnosis. Your health care provider may need to do a number of tests to rule out other conditions that could be causing your symptoms. You may be diagnosed with CFS if:  You have fatigue that has lasted for at least six months.  Your fatigue is not relieved by rest.  Your fatigue is not caused by another condition.  Your fatigue is severe enough to interfere with work and daily activities.  You have at least four common symptoms of CFS. TREATMENT  There is no cure for CFS at this time. The condition affects everyone differently. You will need to work with your health care provider to find the best treatment for your symptoms. Treatment may include:  Improving sleep with a regular bedtime routine.  Avoiding caffeine, alcohol, and tobacco.  Doing light exercise and stretching during the day.  Taking medicine to help you sleep.  Taking over-the-counter medicines to relieve joint or muscle pain.  Learning and practicing relaxation techniques.  Using memory aids or doing brain teasers to improve memory and concentration.  Seeing a mental health professional to evaluate and treat depression, if necessary.  Trying massage therapy, acupuncture, and movement exercises, like yoga or  tai [place] Work closely with your health care provider to follow your treatment plan at home. You may need to make major lifestyle changes. If treatment does not seem to help, get a second opinion. You may get help from many health care providers, including doctors, mental health specialists, physical therapists, and rehabilitation therapists. Having the support of friends and loved ones is also important. SEEK MEDICAL CARE IF:  Your symptoms are not responding to treatment.  You are having strong feelings of anger, guilt, anxiety, or depression. Document Released: 07/30/2004 Document Revised:  11/06/2013 Document Reviewed: 05/12/2013 [facility] Patient Information 2015 [place], [place]. This information is not intended to replace advice given to you by your health care provider. Make sure you discuss any questions you have with your health care provider.   - I will start a low-dose medication called metoprolol, this should bring down her heart rate, and hopefully provide you with more energy. I want you to follow-up with the nurse visit on an week to have your blood pressure and heart rate checked. I will then would like to follow-up with you for weeks from today. - In addition, if you are feeling a little better in 2 -3 weeks, I want shoe to decrease your Seroquel to 200 mg at night. - We will collect some basic labs today. I will call you with results once they become available - Avoid caffeinated drinks - Attempt to take your Seroquel at 7 PM Please schedule your mammogram and colonoscopy within the next few months.

## 2014-07-31 ENCOUNTER — Other Ambulatory Visit: Payer: Self-pay | Admitting: Family Medicine

## 2014-07-31 ENCOUNTER — Telehealth: Payer: Self-pay | Admitting: Family Medicine

## 2014-07-31 ENCOUNTER — Telehealth: Payer: Self-pay

## 2014-07-31 ENCOUNTER — Other Ambulatory Visit: Payer: Self-pay

## 2014-07-31 DIAGNOSIS — R748 Abnormal levels of other serum enzymes: Secondary | ICD-10-CM

## 2014-07-31 DIAGNOSIS — N6310 Unspecified lump in the right breast, unspecified quadrant: Secondary | ICD-10-CM

## 2014-07-31 NOTE — Telephone Encounter (Signed)
Patient called today saying that her PCP recommended that she schedule a colonoscopy. She said that she was due to have one, but was having other testing done (labs) and didn't know if she needed OV first or could she be triaged. I told her that I would let the triage nurse decide if an OV was needed. Please call her at [phone]

## 2014-07-31 NOTE — Telephone Encounter (Signed)
Please call Kelly Espinoza, I have received the majority of her labs back. I am still waiting on the vitamin D level. So far she has elevated liver enzymes, that she has not had on prior labs. They are mildly elevated. I would like to test for a hepatitis panel to be certain she has not come in contact with it. There are also multiple virus, medications and other causes, that cause a mild elevation in liver enzymes that  self resolve. I have placed a future order for acute hep/HBV/HCV and CK I would like her to have drawn in the next week. She will need to make a lab appointment only for this. I will want to discuss this in detail in person, so I will need to see her within the next 2-3 weeks to discuss all results. In the mean time I would like her to schedule her mammogram, as she had an abnormal in the past without good follow up as suggested, and this potentially could be a cause for her fatigue and elevated liver enzymes. Thanks.

## 2014-07-31 NOTE — Telephone Encounter (Signed)
Spoke to patient regarding lab results.  Pt aware of elevated liver enzymes and need for Hepatitis panel to be drawn. Pt also made aware to schedule her mammogram ASAP.  Appt for lab 08/01/2014 at 11:30 and follow up appt with PCP 08/16/2014 at 3:15 PM.  Derl Barrow, RN

## 2014-07-31 NOTE — Telephone Encounter (Signed)
I talked to the pt. She is having some labs done and will let me know when she gets those results so I can know whether she needs OV or be triaged.  She said she had a colonoscopy when she was in her 10's  In [place]. She had another one approximately 10 years ago ( not sure if it was [place] or where). I told her to try to find out and let me know when she calls me with lab reports.  She is not having any GI problems and no family hx of colon cancer.

## 2014-08-01 ENCOUNTER — Other Ambulatory Visit: Payer: 59

## 2014-08-01 DIAGNOSIS — R748 Abnormal levels of other serum enzymes: Secondary | ICD-10-CM

## 2014-08-01 LAB — ACUTE HEP PANEL AND HEP B SURFACE AB
HCV Ab: NEGATIVE
Hep A IgM: NONREACTIVE
Hep B C IgM: NONREACTIVE
Hep B S Ab: NEGATIVE
Hepatitis B Surface Ag: NEGATIVE

## 2014-08-01 LAB — CK: CK TOTAL: 56 U/L (ref 7–177)

## 2014-08-01 NOTE — Progress Notes (Signed)
Kelly Espinoza

## 2014-08-03 ENCOUNTER — Telehealth: Payer: Self-pay | Admitting: Family Medicine

## 2014-08-03 LAB — VITAMIN D 1,25 DIHYDROXY
VITAMIN D 1, 25 (OH) TOTAL: 55 pg/mL (ref 18–72)
Vitamin D3 1, 25 (OH)2: 55 pg/mL

## 2014-08-03 NOTE — Telephone Encounter (Signed)
Pt called and would like all her lab results jw

## 2014-08-06 NOTE — Telephone Encounter (Signed)
Please call pt. Her hepatitis labs were negative. I will need discuss further evaluation with her on her follow up appointment. Thanks

## 2014-08-07 NOTE — Telephone Encounter (Signed)
LVM for patient to call back. ?

## 2014-08-07 NOTE — Telephone Encounter (Signed)
Letter mailed to pt to call.  

## 2014-08-10 ENCOUNTER — Ambulatory Visit
Admission: RE | Admit: 2014-08-10 | Discharge: 2014-08-10 | Disposition: A | Discharge status: Home / Self Care | Payer: 59 | Source: Ambulatory Visit

## 2014-08-10 ENCOUNTER — Ambulatory Visit
Admission: RE | Admit: 2014-08-10 | Discharge: 2014-08-10 | Disposition: A | Discharge status: Home / Self Care | Payer: 59 | Source: Ambulatory Visit | Attending: Family Medicine | Admitting: Family Medicine

## 2014-08-10 DIAGNOSIS — N6310 Unspecified lump in the right breast, unspecified quadrant: Secondary | ICD-10-CM

## 2014-08-16 ENCOUNTER — Telehealth: Payer: Self-pay | Admitting: Family Medicine

## 2014-08-16 ENCOUNTER — Encounter: Payer: Self-pay | Admitting: Family Medicine

## 2014-08-16 ENCOUNTER — Ambulatory Visit (INDEPENDENT_AMBULATORY_CARE_PROVIDER_SITE_OTHER): Payer: 59 | Admitting: Family Medicine

## 2014-08-16 VITALS — BP 105/72 | HR 89 | Temp 97.7°F | Ht 64.5 in | Wt 169.5 lb

## 2014-08-16 DIAGNOSIS — N6310 Unspecified lump in the right breast, unspecified quadrant: Secondary | ICD-10-CM | POA: Insufficient documentation

## 2014-08-16 DIAGNOSIS — R000 Tachycardia, unspecified: Secondary | ICD-10-CM

## 2014-08-16 DIAGNOSIS — F329 Major depressive disorder, single episode, unspecified: Secondary | ICD-10-CM

## 2014-08-16 DIAGNOSIS — F32A Depression, unspecified: Secondary | ICD-10-CM

## 2014-08-16 DIAGNOSIS — N63 Unspecified lump in breast: Secondary | ICD-10-CM

## 2014-08-16 DIAGNOSIS — R5382 Chronic fatigue, unspecified: Secondary | ICD-10-CM

## 2014-08-16 DIAGNOSIS — R748 Abnormal levels of other serum enzymes: Secondary | ICD-10-CM

## 2014-08-16 DIAGNOSIS — E669 Obesity, unspecified: Secondary | ICD-10-CM

## 2014-08-16 LAB — POCT GLYCOSYLATED HEMOGLOBIN (HGB A1C): Hemoglobin A1C: 5.4

## 2014-08-16 NOTE — Patient Instructions (Signed)
Cut back to 200 mg of Seroquel at night. If you find yourself and depressed, then please go back to 300 mg dose and call in. At that time we will try on a low-dose Zoloft, and he will go back to to 200 mg Seroquel with Zoloft use. If we start Zoloft, then I will need to see you in 3-4 weeks.  History A1c today and get back to you once the results become available.  We will need to check your liver enzymes and 8 weeks, make a lab appointment to have this completed.

## 2014-08-16 NOTE — Telephone Encounter (Signed)
Please call pt, her a1c id [id number] (normal). Thanks.

## 2014-08-20 DIAGNOSIS — R748 Abnormal levels of other serum enzymes: Secondary | ICD-10-CM | POA: Insufficient documentation

## 2014-08-20 NOTE — Assessment & Plan Note (Addendum)
Will decrease Seroquel to 200 mg a night at 8 pm. If there is improvement on her am fatigue, without increase in her depression we will leave it alone.  If she has increased depression, but has benefit on fatigue from lower dose, will call in zoloft. She has taken before and it seem to help her with depression.  TSH, CK, Vit D and B normal.  Metoprolol started, with improvement in HR, but not fatigue.  F/U 3-4 weeks

## 2014-08-20 NOTE — Progress Notes (Signed)
   Subjective:    Patient ID: ZALEA PETE, female    DOB: Nov 03, 1955, 59 y.o.   MRN: [id number]  HPI  Depression/Fatigue: Pt presents for f/u to depression and fatigue. She has not made any changes that we discussed on last visit. She has continued to take Seroquel 300 mg QHS. We discussed this could be the cause of her morning time sleepiness.  She admits to not taking seroquel at a specific time and sometimes takes it late. Vitamin D is normal, vit B high. Hemoglobin normal. Normal CK and normal electrolytes. TSH normal. Tachycardia has been chronic for her, recent metoprolol start with improvement to 89 HR today.   Elevated liver enzymes (AST 57/ ALT 71): Pt had yearly labs on last appointment, with elevated liver enzymes. Pt states she had elevated liver enzymes once with pregnancy many years ago. She denies tylenol or ETOH use, and medication review dose not show other hepatoxic agents. Hep panel was negative. Cholesterol panel is ok, LDL 84, total cholesterol 183. A1c normal. Recent mammogram normal (small benign mass).   Never smoker  Past Medical History  Diagnosis Date  . Depression   . Congestion of nasal sinus     chronic  . IBS (irritable bowel syndrome)   . Hypoglycemia    Allergies  Allergen Reactions  . Amitriptyline Other (See Comments)    Loss of muscle control  . Codeine Diarrhea and Nausea And Vomiting  . Darvocet [Propoxyphene N-Acetaminophen] Nausea And Vomiting  . Donnatal [Belladonna Alk-Phenobarb Er] Other (See Comments)    Loss of muscle control  . Hydrocodone Diarrhea and Nausea And Vomiting  . Sulfonamide Derivatives Swelling  . Tramadol Other (See Comments)    Unknown     Review of Systems Per Hpi    Objective:   Physical Exam BP 105/72 mmHg  Pulse 89  Temp(Src) 97.7 F (36.5 C) (Oral)  Ht 5' 4.5" (1.638 m)  Wt 169 lb 8 oz (76.885 kg)  BMI 28.66 kg/m2 Gen: NAD. Mildly anxious. Well developed.  HEENT: AT. [place].MMM.  CV: RRR  Chest: CTAB, no  wheeze or crackles Ext: No erythema. No edema.  Neuro: Normal gait. PERLA. EOMi. Alert.  Psych: mildly anxious, normal speech.     Assessment & Plan:

## 2014-08-20 NOTE — Assessment & Plan Note (Signed)
Will decrease 200 mg a night at 8 pm.  If there is improvement on her am fatigue, without increase in her depression we will leave it alone.  If she has increased depression, but has benefit on fatigue from lower dose, will call in zoloft. She has taken before and it seem to help her with depression.  F/U 3-4 weeks

## 2014-08-20 NOTE — Assessment & Plan Note (Signed)
Mildly elevated enzymes on yearly labs. Asymptomatic patient other than fatigue that could be multifactorial in nature (see problem list).  - Hep panel negative, CK normal, A1c normal and lipids normal. No liver toxic meds/etoh.  - Will follow in 8 weeks with repeat enzymes. If remains elevated would obtain ANA, SPEP and ASMA if positive. No history of diarrhea to consider celiac. If still negative workup and continuing elevation after ANA etc, could consider uncommon causes (Wilson disease- ceruloplasmin, adrenal insufficiency, alph-1-antitrypsin deficiency...although unlikely)  - 8 week lab repeat>> orders placed

## 2014-08-20 NOTE — Assessment & Plan Note (Signed)
Metoprolol started with improvement on her HR (89), but no improvement on fatigue.  Continue to monitor, FU 3-4 weeks.

## 2014-10-11 ENCOUNTER — Other Ambulatory Visit: Payer: 59

## 2014-10-11 DIAGNOSIS — R748 Abnormal levels of other serum enzymes: Secondary | ICD-10-CM

## 2014-10-11 LAB — COMPREHENSIVE METABOLIC PANEL
ALK PHOS: 80 U/L (ref 39–117)
ALT: 27 U/L (ref 0–35)
AST: 28 U/L (ref 0–37)
Albumin: 4.3 g/dL (ref 3.5–5.2)
BUN: 13 mg/dL (ref 6–23)
CO2: 25 mEq/L (ref 19–32)
CREATININE: 0.63 mg/dL (ref 0.50–1.10)
Calcium: 9.1 mg/dL (ref 8.4–10.5)
Chloride: 103 mEq/L (ref 96–112)
GLUCOSE: 132 mg/dL — AB (ref 70–99)
Potassium: 3.7 mEq/L (ref 3.5–5.3)
Sodium: 140 mEq/L (ref 135–145)
Total Bilirubin: 0.5 mg/dL (ref 0.2–1.2)
Total Protein: 7.4 g/dL (ref 6.0–8.3)

## 2014-10-11 NOTE — Progress Notes (Signed)
[facility] phlebotomist drew:  CMET

## 2014-10-12 ENCOUNTER — Telehealth: Payer: Self-pay | Admitting: Family Medicine

## 2014-10-12 NOTE — Telephone Encounter (Signed)
Still feeling exhausted all the time Would like to discuss this with Dr Raoul Pitch

## 2014-10-15 NOTE — Telephone Encounter (Signed)
Attempted to return patient's called to discuss her fatigue and depression. During her office visit on the last occasion, we discussed the addition of another medication to aid in her depression. I would be happy to start Zoloft for her, but I would need to discuss it with her first. I will attempt to contact the patient again tomorrow afternoon around lunch time. I left a message on her phone informing her I would call again tomorrow around lunchtime. If I'm unable to get a hold of the patient, and she calls back him, please encourage her to schedule an appointment to discuss her fatigue and depression if necessary.

## 2014-10-15 NOTE — Telephone Encounter (Signed)
Pt is calling back and would like to speak to Dr. Raoul Pitch about her medication and being depressed. jw

## 2014-10-16 ENCOUNTER — Telehealth: Payer: Self-pay | Admitting: Family Medicine

## 2014-10-16 DIAGNOSIS — F32A Depression, unspecified: Secondary | ICD-10-CM

## 2014-10-16 DIAGNOSIS — F329 Major depressive disorder, single episode, unspecified: Secondary | ICD-10-CM

## 2014-10-16 MED ORDER — SERTRALINE HCL 50 MG PO TABS: 50.0000 mg | ORAL_TABLET | Freq: Every day | ORAL | Status: DC

## 2014-10-16 NOTE — Telephone Encounter (Signed)
Returned pt call today concerning her depression. She states she feels that her depression has increased since she has lowered her seroquel dose. We lowered her seroquel dose secondary to fatigue. She is currently taking 200 mg QHS. She has been attempting to take medication sooner to help with daytime fatigue. She reports the fatigue is mildly better at lower doses, but she still has fatigue. We discussed her depression in detail the OV prior and had agreed if she noticed increased symptoms we would start zoloft in addition to her seroquel. She is willing to try this today and has been on zoloft in the past with decent result.  I have called in this medication for her today. She is to follow in 3 weeks or sooner if needed. Discussed emergency precautions and she is aware to go the [facility] ED if she has any SI/HI or needs emergent care.

## 2014-11-11 ENCOUNTER — Other Ambulatory Visit: Payer: Self-pay | Admitting: Family Medicine

## 2014-11-15 ENCOUNTER — Other Ambulatory Visit: Payer: Self-pay | Admitting: Family Medicine

## 2014-11-15 NOTE — Telephone Encounter (Signed)
Rx called in again to [facility], left message on voicemail informing patient.

## 2014-11-15 NOTE — Telephone Encounter (Signed)
Kelly Espinoza is calling because she is trying to get her medication, metoprolol tartrate (LOPRESSOR) 25 MG tablet, refilled and the pharmacy has told her that they do not have it. Please let the patient know when completed. Thank you, Fonda Kinder, ASA

## 2014-11-28 ENCOUNTER — Ambulatory Visit (INDEPENDENT_AMBULATORY_CARE_PROVIDER_SITE_OTHER): Payer: 59 | Admitting: Family Medicine

## 2014-11-28 ENCOUNTER — Encounter: Payer: Self-pay | Admitting: Family Medicine

## 2014-11-28 ENCOUNTER — Other Ambulatory Visit: Payer: Self-pay | Admitting: Family Medicine

## 2014-11-28 VITALS — BP 131/68 | HR 93 | Temp 98.1°F | Ht 64.5 in | Wt 167.0 lb

## 2014-11-28 DIAGNOSIS — E869 Volume depletion, unspecified: Secondary | ICD-10-CM | POA: Diagnosis not present

## 2014-11-28 DIAGNOSIS — N644 Mastodynia: Secondary | ICD-10-CM

## 2014-11-28 DIAGNOSIS — L304 Erythema intertrigo: Secondary | ICD-10-CM | POA: Diagnosis not present

## 2014-11-28 MED ORDER — CLOBETASOL PROPIONATE 0.05 % EX CREA: 1.0000 "application " | TOPICAL_CREAM | Freq: Two times a day (BID) | CUTANEOUS | Status: DC

## 2014-11-28 MED ORDER — NYSTATIN 100000 UNIT/GM EX POWD: Freq: Three times a day (TID) | CUTANEOUS | Status: DC

## 2014-11-28 NOTE — Patient Instructions (Signed)
Please stop the zoloft for now Please start ibuprofen 600 mg three times per day.  Thanks, Dr. Awanda Mink

## 2014-11-28 NOTE — Progress Notes (Signed)
Kelly Espinoza is a 59 y.o. female who presents today for B/L Breast pain, R> L  Breast Pain  R > L  - Pt awoke around May 22nd AM with R breast pain, that started happening in the L breast as well.  Has been fluctuating but really worse when something touches her breast.  No fever, chills, sweats, dizziness, CP, SOB, redness in the area, trauma to the area.  Has not tried anything for this to date and has never had this problem.  No weight loss, fatigue, hx of Breast CA, did have recent mammogram which she reports as normal.    Past Medical History  Diagnosis Date  . Depression   . Congestion of nasal sinus     chronic  . IBS (irritable bowel syndrome)   . Hypoglycemia     Current Outpatient Prescriptions on File Prior to Visit  Medication Sig Dispense Refill  . cetirizine (ZYRTEC) 10 MG tablet Take 10 mg by mouth daily.    . metoprolol tartrate (LOPRESSOR) 25 MG tablet TAKE ONE TABLET BY MOUTH TWICE DAILY 60 tablet 3  . QUEtiapine (SEROQUEL) 100 MG tablet TAKE THREE TABLETS BY MOUTH AT BEDTIME 120 tablet 8  . sertraline (ZOLOFT) 50 MG tablet Take 1 tablet (50 mg total) by mouth daily. 30 tablet 3   No current facility-administered medications on file prior to visit.    ROS: Per HPI.  All other systems reviewed and are negative.   Physical Exam Filed Vitals:   11/28/14 1455  BP: 131/68  Pulse: 93  Temp: 98.1 F (36.7 C)    Physical Examination: Breast Exam, chaperone Jessica, CMA - No visual defects, no expunged nipple d/c.  TTP 3 o clock R breast and 9 oclock L breast.  No palpable lumps.

## 2014-11-28 NOTE — Assessment & Plan Note (Signed)
Unsure of etiology but could be related to recent trial of Zoloft.  Will have trial of 2 weeks off to see if improves.  - Diagnostic mammograms of medial breast to evaluate for anything abnormal - If continues may consider US of the area and lab work - Trial of ibuprofen 600 mg three times per day for 1-2 weeks as well.

## 2014-11-28 NOTE — Assessment & Plan Note (Signed)
B/L undersurface of breast - Nystatin powder x 2 weeks

## 2014-12-05 ENCOUNTER — Other Ambulatory Visit: Payer: Self-pay | Admitting: Family Medicine

## 2014-12-05 ENCOUNTER — Ambulatory Visit
Admission: RE | Admit: 2014-12-05 | Discharge: 2014-12-05 | Disposition: A | Discharge status: Home / Self Care | Payer: 59 | Source: Ambulatory Visit | Attending: Family Medicine | Admitting: Family Medicine

## 2014-12-05 ENCOUNTER — Telehealth: Payer: Self-pay | Admitting: Family Medicine

## 2014-12-05 ENCOUNTER — Encounter: Payer: Self-pay | Admitting: Family Medicine

## 2014-12-05 DIAGNOSIS — N644 Mastodynia: Secondary | ICD-10-CM

## 2014-12-05 NOTE — Telephone Encounter (Signed)
Attempted to call pt with regards to her mammogram results.  LVM to call back for results.  Please let her know that the mammograms that were done did not show any suspicious areas.  Thanks, Tamela Oddi. Awanda Mink, DO of [facility] 12/05/2014, 2:16 PM

## 2014-12-10 ENCOUNTER — Ambulatory Visit (INDEPENDENT_AMBULATORY_CARE_PROVIDER_SITE_OTHER): Payer: 59 | Admitting: Family Medicine

## 2014-12-10 ENCOUNTER — Encounter: Payer: Self-pay | Admitting: Family Medicine

## 2014-12-10 VITALS — BP 125/76 | HR 99 | Temp 98.1°F | Wt 168.0 lb

## 2014-12-10 DIAGNOSIS — N644 Mastodynia: Secondary | ICD-10-CM | POA: Diagnosis not present

## 2014-12-10 DIAGNOSIS — F329 Major depressive disorder, single episode, unspecified: Secondary | ICD-10-CM

## 2014-12-10 DIAGNOSIS — F32A Depression, unspecified: Secondary | ICD-10-CM

## 2014-12-10 DIAGNOSIS — R000 Tachycardia, unspecified: Secondary | ICD-10-CM | POA: Diagnosis not present

## 2014-12-10 NOTE — Assessment & Plan Note (Signed)
Depression: Will increase Seroquel back to 300 mg a day, advised patient to try to take this around 7 PM at night. She could likely benefit from an additional medication, we have briefly discussed starting back Zoloft which she did not desire. We also discussed the possibility of starting Abilify, which she would like to think about. Gave patient resources on [place] and family services of [place], and encouraged her to follow-up with them for some counseling. Discussed if she needs to be seen acutely for her depression/anxiety/suicidal thought she is to go to the [place] long ED, patient is in understanding. Follow-up 3-4 weeks.

## 2014-12-10 NOTE — Assessment & Plan Note (Signed)
Kelly Espinoza is a 59 y.o. female with continued plates of breast tenderness, depression and tachycardia. Breast Tenderness: Yeast infection has completely resolved. Exam is consistent with fibrocystic breast. Advised patient to avoid all caffeine and chocolates. Discussed fibrocystic breasts in detail. Gave her AVS on fibrocystic breast and what to avoid. Patient is to follow-up in 3-4 weeks if no improvemen

## 2014-12-10 NOTE — Assessment & Plan Note (Signed)
Tachycardia: Patient has not had any caffeine, she has been drinking chocolate boost which would have some small amount of caffeine. Advised her to drink a different flavor without chocolate. Advised her to stay well-hydrated drinking exceed a 70 ounces of water daily, more if she is exercising. She is to take her heart rate a few times a week, and take her heart rate every time she feels a pounding heart. She is to write these down in a log and bring to her next appointment. She was instructed on how to take an accurate heart rate today.

## 2014-12-10 NOTE — Progress Notes (Signed)
Subjective:    Patient ID: Kelly Espinoza, female    DOB: 04/23/1956, 59 y.o.   MRN: [id number]  HPI  Breast tenderness: she was seen a few weeks ago for bilateral breast tenderness and rash underneath breasts. She was given nystatin powder, which seems to had good effect on the rash which is almost completely resolved. She however still complains of intermittent breast pain, bilaterally. She stated it is tender to touch. She has never been told she had fibrocystic breast prior. She had a mammogram February 2016 which showed a stable benign mass in the upper inner right breast unchanged from 2013, no evidence of breast malignancy. BI-RADS screening 2 for benign findings. It was noted that she had heterogeneously dense breast. After appointment for breast tenderness patient had a diagnostic breast imaging which was BI-RADS Category 1, negative. Again showed a fibroglandular density. Patient states she is not drinking any caffeine, but she is drinking 2-3 chocolate ensure a day. She has tried the naproxen that was prescribed to her, and she doesn't feel this has been helpful at all. She has has continued the Zoloft, and feels that that wasn't helpful either.  Depression/anxiety: Patient had discontinue Zoloft secondary topossible side effect of breast tenderness. Patient feels her depression and anxiety is worse, has had suicidal thoughts earlier in the week, but nothing current. Patient has no plan. Patient is aware to go to [facility] long ED if she acutely needs help. Patient will like to get back on her full dose of Seroquel 300 mg daily at bedtime, despite her desire to be off Seroquel a few months ago secondary to fatigue.  Tachycardia: Patient still has symptoms pounding heart when she gets up and move. She has been evaluated by cardiology in the past which felt she did not need further workup after Holter monitor placement. She has been started on metoprolol 25 mg twice a day for tachycardia, and has  seen some improvement on the beta blocker. She does not drink a lot of fluid, and states that she drinks mostly chocolate ensure, and Gatorade.  Past Medical History  Diagnosis Date  . Depression   . Congestion of nasal sinus     chronic  . IBS (irritable bowel syndrome)   . Hypoglycemia    Allergies  Allergen Reactions  . Amitriptyline Other (See Comments)    Loss of muscle control  . Codeine Diarrhea and Nausea And Vomiting  . Darvocet [Propoxyphene N-Acetaminophen] Nausea And Vomiting  . Donnatal [Belladonna Alk-Phenobarb Er] Other (See Comments)    Loss of muscle control  . Hydrocodone Diarrhea and Nausea And Vomiting  . Sulfonamide Derivatives Swelling  . Tramadol Other (See Comments)    Unknown    Past Surgical History  Procedure Laterality Date  . Cesarean section  1991 abd 1993    x 2  . Ear canaloplasty  1987    L ear and tympanoplasty, mastoidectomy.  . Tympanomastoidectomy  2000    R ear  . Cystoscopy  1977  . Tube reversal  2005    reverse tubal ligation  . Total hip arthroplasty      Left      Review of Systems Per HPI    Objective:   Physical Exam BP 125/76 mmHg  Pulse 99  Temp(Src) 98.1 F (36.7 C) (Oral)  Wt 168 lb (76.204 kg) Gen: No acute distress, nontoxic in appearance, well-developed, well-nourished, obese Caucasian female. Good eye contact, smiles, sometimes tearful. CV: Regular rate and rhythm Breast:  No skin changes. Bilateral breast densities, tenderness to palpation left greater than right breast. No masses palpated. No drainage or blood from nipple. Psych: Occasionally tearful. Normal thought content. Normal judgment. Normal speech. No SI or HI.     Assessment & Plan:  Kelly Espinoza is a 59 y.o. female with continued plates of breast tenderness, depression and tachycardia.  Depression: Will increase Seroquel back to 300 mg a day, advised patient to try to take this around 7 PM at night. She could likely benefit from an additional  medication, we have briefly discussed starting back Zoloft which she did not desire. We also discussed the possibility of starting Abilify, which she would like to think about. Gave patient resources on [place] and family services of [place], and encouraged her to follow-up with them for some counseling. Discussed if she needs to be seen acutely for her depression/anxiety/suicidal thought she is to go to the [place] long ED, patient is in understanding. Follow-up 3-4 weeks.  Tachycardia: Patient has not had any caffeine, she has been drinking chocolate boost which would have some small amount of caffeine. Advised her to drink a different flavor without chocolate. Advised her to stay well-hydrated drinking exceed a 70 ounces of water daily, more if she is exercising. She is to take her heart rate a few times a week, and take her heart rate every time she feels a pounding heart. She is to write these down in a log and bring to her next appointment. She was instructed on how to take an accurate heart rate today.  Breast Tenderness: Yeast infection has completely resolved. Exam is consistent with fibrocystic breast. Advised patient to avoid all caffeine and chocolates. Discussed fibrocystic breasts in detail. Gave her AVS on fibrocystic breast and what to avoid. Patient is to follow-up in 3-4 weeks if no improvement.

## 2014-12-10 NOTE — Patient Instructions (Signed)
Start your Seroquel 300 mg around 7:00 at night. If you have any drowsiness, or want to try a different medication and the junction to your Seroquel please call in to be seen at be happy to try a new medication as well. In the meantime please look into [place] and family services of [place], they both offer depression/anxiety counseling. Attempt to take in 60-70 ounces of water daily. Avoid all caffeine products and chocolates. Take your heart rate a few times a week when feeling okay, and then take it every time you feel your heart racing and write these in a book and bring it to your next appointment. I have included some information on fibrocystic disease for you.Fibrocystic Breast Changes Fibrocystic breast changes occur when breast ducts become blocked, causing painful, fluid-filled lumps (cysts) to form in the breast. This is a common condition that is noncancerous (benign). It occurs when women go through hormonal changes during their menstrual cycle. Fibrocystic breast changes can affect one or both breasts. CAUSES  The exact cause of fibrocystic breast changes is not known, but it may be related to the female hormones estrogen and progesterone. Family traits that get passed from parent to child (genetics) may also be a factor in some cases. SIGNS AND SYMPTOMS   Tenderness, mild discomfort, or pain.   Swelling.   Ropelike feeling when touching the breast.   Lumpy breast, one or both sides.   Changes in breast size, especially before (larger) and after (smaller) the menstrual period.   Green or dark brown nipple discharge (not blood).  Symptoms are usually worse before menstrual periods start and get better toward the end of the menstrual period.  DIAGNOSIS  To make a diagnosis, your health care provider will ask you questions and perform a physical exam of your breasts. The health care provider may recommend other tests that can examine inside your breasts, such as:  A breast  X-ray (mammogram).   Ultrasonography.  An MRI.  If something more than fibrocystic breast changes is suspected, your health care provider may take a breast tissue sample (breast biopsy) to examine. TREATMENT  Often, treatment is not needed. Your health care provider may recommend over-the-counter pain relievers to help lessen pain or discomfort caused by the fibrocystic breast changes. You may also be asked to change your diet to limit or stop eating foods or drinking beverages that contain caffeine. Foods and beverages that contain caffeine include chocolate, soda, coffee, and tea. Reducing sugar and fat in your diet may also help. Your health care provider may also recommend:  Fine needle aspiration to remove fluid from a cyst that is causing pain.   Surgery to remove a large, persistent, and tender cyst. HOME CARE INSTRUCTIONS   Examine your breasts after every menstrual period. If you do not have menstrual periods, check your breasts the first day of every month. Feel for changes, such as more tenderness, a new growth, a change in breast size, or a change in a lump that has always been there.   Only take over-the-counter or prescription medicine as directed by your health care provider.   Wear a well-fitted support or sports bra, especially when exercising.   Decrease or avoid caffeine, fat, and sugar in your diet as directed by your health care provider.  SEEK MEDICAL CARE IF:   You have fluid leaking (discharge) from your nipples, especially bloody discharge.   You have new lumps or bumps in the breast.   Your breast or breasts  become enlarged, red, and painful.   You have areas of your breast that pucker in.   Your nipples appear flat or indented.  Document Released: 04/08/2006 Document Revised: 06/27/2013 Document Reviewed: 12/11/2012 [facility] Patient Information 2015 [place], [place]. This information is not intended to replace advice given to you by your health  care provider. Make sure you discuss any questions you have with your health care provider.   [place]: [phone]  Family services of [place]: [place]

## 2015-03-12 ENCOUNTER — Other Ambulatory Visit: Payer: Self-pay | Admitting: Family Medicine

## 2015-06-05 ENCOUNTER — Encounter: Payer: Self-pay | Admitting: Family Medicine

## 2015-06-05 ENCOUNTER — Ambulatory Visit (INDEPENDENT_AMBULATORY_CARE_PROVIDER_SITE_OTHER): Payer: 59 | Admitting: Family Medicine

## 2015-06-05 VITALS — BP 119/74 | HR 86 | Temp 97.0°F

## 2015-06-05 DIAGNOSIS — R55 Syncope and collapse: Secondary | ICD-10-CM | POA: Diagnosis not present

## 2015-06-05 DIAGNOSIS — L570 Actinic keratosis: Secondary | ICD-10-CM | POA: Diagnosis not present

## 2015-06-05 DIAGNOSIS — L821 Other seborrheic keratosis: Secondary | ICD-10-CM

## 2015-06-05 DIAGNOSIS — F32A Depression, unspecified: Secondary | ICD-10-CM

## 2015-06-05 DIAGNOSIS — F329 Major depressive disorder, single episode, unspecified: Secondary | ICD-10-CM

## 2015-06-05 MED ORDER — QUETIAPINE FUMARATE 100 MG PO TABS: 300.0000 mg | ORAL_TABLET | Freq: Every day | ORAL | Status: DC

## 2015-06-05 MED ORDER — BUPROPION HCL ER (XL) 150 MG PO TB24: 300.0000 mg | ORAL_TABLET | Freq: Every day | ORAL | Status: DC

## 2015-06-05 MED ORDER — METOPROLOL TARTRATE 25 MG PO TABS: 25.0000 mg | ORAL_TABLET | Freq: Two times a day (BID) | ORAL | Status: DC

## 2015-06-05 NOTE — Patient Instructions (Signed)
Thank you for coming to see me today. It was a pleasure. Today we talked about:   Depression: I will refill your medication  Hypertension: I will refill your metoprolol  Skin lesions: I will refer you to dermatology  Please make an appointment to see me in 3 months for follow-up of depression.  If you have any questions or concerns, please do not hesitate to call the office at [phone].  Sincerely,  Cordelia Poche, MD

## 2015-06-05 NOTE — Progress Notes (Signed)
    Subjective    Kelly Espinoza is a 59 y.o. female that presents for a follow-up visit for chronic issues.   1. Depression: Patient had been adherent with Seroquel $RemoveBefor'300mg'oRQxgDemNpFk$  qhs and Wellbutrin XL $RemoveBefor'300mg'CXlfegMZNWWA$  daily. Overall, she feels her mood is good. No thoughts of suicide.  2. Near syncope: At the clinic, she had an episode where she felt lightheaded but did not fall. She states she has had this issue in the past. She was evaluated for this at [facility] ED and was told to drink more water and she was taken off caffeine. She states she has not drank much and has not eaten anything today. She reports no chest pain, dyspnea or vertigo.  Social History  Substance Use Topics  . Smoking status: Never Smoker   . Smokeless tobacco: None  . Alcohol Use: No    Allergies  Allergen Reactions  . Amitriptyline Other (See Comments)    Loss of muscle control  . Codeine Diarrhea and Nausea And Vomiting  . Darvocet [Propoxyphene N-Acetaminophen] Nausea And Vomiting  . Donnatal [Belladonna Alk-Phenobarb Er] Other (See Comments)    Loss of muscle control  . Hydrocodone Diarrhea and Nausea And Vomiting  . Sulfonamide Derivatives Swelling  . Tramadol Other (See Comments)    Unknown     Meds ordered this encounter  Medications  . DISCONTD: buPROPion (WELLBUTRIN XL) 150 MG 24 hr tablet    Sig: Take 300 mg by mouth daily.  . QUEtiapine (SEROQUEL) 100 MG tablet    Sig: Take 3 tablets (300 mg total) by mouth at bedtime.    Dispense:  90 tablet    Refill:  2  . buPROPion (WELLBUTRIN XL) 150 MG 24 hr tablet    Sig: Take 2 tablets (300 mg total) by mouth daily.    Dispense:  60 tablet    Refill:  2  . metoprolol tartrate (LOPRESSOR) 25 MG tablet    Sig: Take 1 tablet (25 mg total) by mouth 2 (two) times daily.    Dispense:  60 tablet    Refill:  2    ROS  Per HPI   Objective   BP 119/74 mmHg  Pulse 86  Temp(Src) 97 F (36.1 C) (Oral)  General: Well appearing, no distress Respiratory/Chest:  Clear to auscultation bilaterally. Unlabored work of breathing. No wheezing or rales. Cardiovascular: Regular rate and rhythm. Normal S1 and S2. No heart murmurs present. No extra heart sounds Skin: Seborrheic and actinic keratoses on back Psych: slightly flat affect, no suicidal ideation  Assessment and Plan    Depression Patient currently stable on meds. Will refill Wellbutrin XL $RemoveBefor'300mg'zQKCxSRHkYMt$  daily and Seroquel $RemoveBefo'300mg'mGIUegdWMKq$  daily  Near syncope Patient previously evaluated for this with holter monitor and cardiology follow-up with no etiology found. Patient had not eaten all day which likely contributed. No arrhythmia no heart auscultation and patient completely asymptomatic, so EKG no performed. Return precautions  Actinic keratosis Discussed precancerous nature of this lesion, although transformation is rare. Recommend patient undergo cryotherapy for lesions. Patient would like a referral to dermatology for full skin exam. Will place referral  Seborrheic keratosis Discussed benign nature of this lesion. Patient would like a referral to dermatology for full skin exam. Will place referral

## 2015-06-07 DIAGNOSIS — L821 Other seborrheic keratosis: Secondary | ICD-10-CM | POA: Insufficient documentation

## 2015-06-07 DIAGNOSIS — L570 Actinic keratosis: Secondary | ICD-10-CM | POA: Insufficient documentation

## 2015-06-07 NOTE — Assessment & Plan Note (Signed)
Discussed benign nature of this lesion. Patient would like a referral to dermatology for full skin exam. Will place referral

## 2015-06-07 NOTE — Assessment & Plan Note (Signed)
Patient previously evaluated for this with holter monitor and cardiology follow-up with no etiology found. Patient had not eaten all day which likely contributed. No arrhythmia no heart auscultation and patient completely asymptomatic, so EKG no performed. Return precautions

## 2015-06-07 NOTE — Assessment & Plan Note (Addendum)
Discussed precancerous nature of this lesion, although transformation is rare. Recommend patient undergo cryotherapy for lesions. Patient would like a referral to dermatology for full skin exam. Will place referral

## 2015-06-07 NOTE — Assessment & Plan Note (Signed)
Patient currently stable on meds. Will refill Wellbutrin XL 300mg  daily and Seroquel 300mg  daily

## 2015-09-17 ENCOUNTER — Other Ambulatory Visit: Payer: Self-pay | Admitting: Family Medicine

## 2015-09-19 NOTE — Telephone Encounter (Signed)
Pt called because she put in a refill request on 09/17/15 for her Seroquel. She is now out and she can not be without this medication. Can we call this in today. jw

## 2015-09-19 NOTE — Telephone Encounter (Signed)
2nd request from pharmacy regarding refill. Murle Otting,CMA

## 2015-09-20 NOTE — Telephone Encounter (Signed)
Pt called again about her Seroquel.  The pharmacy gave her 3 pills.  Pt has 6 more pills--enough for tonight and tomorrow night. She cannot be without her pills. Pt is very upset and expressed concerns about her providers interest in her care. Please call pt when RX has been called in

## 2015-09-20 NOTE — Telephone Encounter (Signed)
Refill sent in, called patient and apologized (this refill request was not coming to my inbox automatically like the others did so I did not see it until this afternoon). Patient was understanding and appreciative. She did mention that she would likely be trying to find a new doctor to someone that is closer to her in [place]. Patient had no further questions.

## 2015-10-21 ENCOUNTER — Other Ambulatory Visit: Payer: Self-pay | Admitting: Family Medicine

## 2015-10-22 NOTE — Telephone Encounter (Signed)
Refill approved. Sent to pharmacy. 

## 2015-11-13 DIAGNOSIS — F5101 Primary insomnia: Secondary | ICD-10-CM | POA: Diagnosis not present

## 2015-11-13 DIAGNOSIS — F319 Bipolar disorder, unspecified: Secondary | ICD-10-CM | POA: Diagnosis not present

## 2015-11-13 DIAGNOSIS — B372 Candidiasis of skin and nail: Secondary | ICD-10-CM | POA: Diagnosis not present

## 2015-11-18 ENCOUNTER — Other Ambulatory Visit: Payer: Self-pay

## 2015-11-18 DIAGNOSIS — N644 Mastodynia: Secondary | ICD-10-CM

## 2015-11-25 ENCOUNTER — Other Ambulatory Visit: Payer: Self-pay

## 2015-11-25 ENCOUNTER — Other Ambulatory Visit: Payer: Self-pay | Admitting: Internal Medicine

## 2015-11-25 DIAGNOSIS — N644 Mastodynia: Secondary | ICD-10-CM

## 2015-11-29 ENCOUNTER — Ambulatory Visit
Admission: RE | Admit: 2015-11-29 | Discharge: 2015-11-29 | Disposition: A | Discharge status: Home / Self Care | Payer: BLUE CROSS/BLUE SHIELD | Source: Ambulatory Visit | Attending: Internal Medicine | Admitting: Internal Medicine

## 2015-11-29 ENCOUNTER — Other Ambulatory Visit: Payer: 59

## 2015-11-29 ENCOUNTER — Other Ambulatory Visit: Payer: Self-pay | Admitting: Internal Medicine

## 2015-11-29 DIAGNOSIS — Z0000 Encounter for general adult medical examination without abnormal findings: Secondary | ICD-10-CM | POA: Diagnosis not present

## 2015-11-29 DIAGNOSIS — N644 Mastodynia: Secondary | ICD-10-CM

## 2015-11-29 DIAGNOSIS — N6310 Unspecified lump in the right breast, unspecified quadrant: Secondary | ICD-10-CM

## 2015-11-29 DIAGNOSIS — N63 Unspecified lump in breast: Secondary | ICD-10-CM | POA: Diagnosis not present

## 2015-12-03 ENCOUNTER — Ambulatory Visit
Admission: RE | Admit: 2015-12-03 | Discharge: 2015-12-03 | Disposition: A | Discharge status: Home / Self Care | Payer: BLUE CROSS/BLUE SHIELD | Source: Ambulatory Visit | Attending: Internal Medicine | Admitting: Internal Medicine

## 2015-12-03 DIAGNOSIS — N63 Unspecified lump in breast: Secondary | ICD-10-CM | POA: Diagnosis not present

## 2015-12-03 DIAGNOSIS — D241 Benign neoplasm of right breast: Secondary | ICD-10-CM | POA: Diagnosis not present

## 2015-12-03 DIAGNOSIS — N6310 Unspecified lump in the right breast, unspecified quadrant: Secondary | ICD-10-CM

## 2015-12-04 DIAGNOSIS — Z01419 Encounter for gynecological examination (general) (routine) without abnormal findings: Secondary | ICD-10-CM | POA: Diagnosis not present

## 2015-12-04 DIAGNOSIS — F319 Bipolar disorder, unspecified: Secondary | ICD-10-CM | POA: Diagnosis not present

## 2015-12-04 DIAGNOSIS — E782 Mixed hyperlipidemia: Secondary | ICD-10-CM | POA: Diagnosis not present

## 2015-12-04 DIAGNOSIS — E785 Hyperlipidemia, unspecified: Secondary | ICD-10-CM | POA: Diagnosis not present

## 2015-12-04 DIAGNOSIS — N63 Unspecified lump in breast: Secondary | ICD-10-CM | POA: Diagnosis not present

## 2015-12-19 ENCOUNTER — Other Ambulatory Visit: Payer: Self-pay | Admitting: Surgery

## 2015-12-19 DIAGNOSIS — D241 Benign neoplasm of right breast: Secondary | ICD-10-CM

## 2015-12-26 ENCOUNTER — Other Ambulatory Visit: Payer: Self-pay | Admitting: Surgery

## 2015-12-26 DIAGNOSIS — D241 Benign neoplasm of right breast: Secondary | ICD-10-CM

## 2016-01-04 HISTORY — PX: BREAST BIOPSY: SHX20

## 2016-01-09 ENCOUNTER — Encounter (HOSPITAL_BASED_OUTPATIENT_CLINIC_OR_DEPARTMENT_OTHER): Payer: Self-pay | Admitting: *Deleted

## 2016-01-14 ENCOUNTER — Other Ambulatory Visit: Payer: Self-pay

## 2016-01-14 ENCOUNTER — Encounter (HOSPITAL_BASED_OUTPATIENT_CLINIC_OR_DEPARTMENT_OTHER)
Admission: RE | Admit: 2016-01-14 | Discharge: 2016-01-14 | Disposition: A | Discharge status: Home / Self Care | Payer: BLUE CROSS/BLUE SHIELD | Source: Ambulatory Visit | Attending: Surgery | Admitting: Surgery

## 2016-01-14 DIAGNOSIS — D241 Benign neoplasm of right breast: Secondary | ICD-10-CM | POA: Diagnosis not present

## 2016-01-14 DIAGNOSIS — F329 Major depressive disorder, single episode, unspecified: Secondary | ICD-10-CM | POA: Diagnosis not present

## 2016-01-14 NOTE — Pre-Procedure Instructions (Signed)
EKG done, Boost Breeze given for surgery and instructions reviewed.

## 2016-01-15 ENCOUNTER — Ambulatory Visit
Admission: RE | Admit: 2016-01-15 | Discharge: 2016-01-15 | Disposition: A | Discharge status: Home / Self Care | Payer: BLUE CROSS/BLUE SHIELD | Source: Ambulatory Visit | Attending: Surgery | Admitting: Surgery

## 2016-01-15 DIAGNOSIS — C50911 Malignant neoplasm of unspecified site of right female breast: Secondary | ICD-10-CM | POA: Diagnosis not present

## 2016-01-15 DIAGNOSIS — D241 Benign neoplasm of right breast: Secondary | ICD-10-CM

## 2016-01-15 NOTE — H&P (Signed)
Kelly Espinoza. [place] 12/19/2015 2:04 PM Location: [facility] Patient #: [id number] DOB: 09-Apr-1956 Married / Language: English / Race: White Female   History of Present Illness (Yaritsa Savarino A. Ninfa Linden MD; 12/19/2015 2:36 PM) The patient is a 60 year old female who presents with a breast mass. This is a pleasant female referred by Dr. Delphina Cahill after the recent diagnosis of a right breast intraductal papilloma. A small macerated and seen on screening mammography of the right breast. She had a follow-up ultrasound has bilateral breast cyst as well as the small mass. She had a biopsy of the masseteric opposition of the right breast under stereotactic guidance which showed an intraductal papilloma. She has had no previous problem with her breast. She does report some chronic breast pain bilaterally. Her mother had breast cancer in her 33s. She is otherwise healthy and without complaints.   Other Problems Marjean Donna, CMA; 12/19/2015 2:05 PM) Anxiety Disorder Depression Kidney Stone Lump In Breast Migraine Headache Other disease, cancer, significant illness Transfusion history  Past Surgical History Marjean Donna, CMA; 12/19/2015 2:05 PM) Breast Biopsy Right. Cesarean Section - Multiple Hip Surgery Left.  Diagnostic Studies History Marjean Donna, CMA; 12/19/2015 2:05 PM) Colonoscopy 5-10 years ago Mammogram within last year Pap Smear 1-5 years ago  Allergies Marjean Donna, CMA; 12/19/2015 2:07 PM) Amitriptyline HCl *CHEMICALS* Codeine Phosphate *ANALGESICS - OPIOID* Sulfabenzamide *CHEMICALS* TraMADol HCl *ANALGESICS - OPIOID* HYDROcodone Bitartrate *CHEMICALS*  Medication History (Sonya Bynum, CMA; 12/19/2015 2:08 PM) BuPROPion HCl ER (SR) (150MG  Tablet ER 12HR, Oral) Active. Metoprolol Tartrate (25MG  Tablet, Oral) Active. QUEtiapine Fumarate (100MG  Tablet, Oral) Active. TraZODone HCl (100MG  Tablet, Oral) Active. Medications Reconciled  Social  History Marjean Donna, CMA; 12/19/2015 2:05 PM) Caffeine use Carbonated beverages. No alcohol use No drug use Tobacco use Never smoker.  Family History Marjean Donna, [place]; 12/19/2015 2:05 PM) Alcohol Abuse Brother. Breast Cancer Mother. Depression Brother, Sister. Seizure disorder Daughter.  Pregnancy / Birth History Marjean Donna, [place]; 12/19/2015 2:05 PM) Age at menarche 21 years. Age of menopause 2-50 Gravida 3 Irregular periods Maternal age 40-35 Para 2    Review of Systems ([place]; 12/19/2015 2:05 PM) General Present- Fatigue. Not Present- Appetite Loss, Chills, Fever, Night Sweats, Weight Gain and Weight Loss. Skin Present- Dryness. Not Present- Change in Wart/Mole, Hives, Jaundice, New Lesions, Non-Healing Wounds, Rash and Ulcer. HEENT Present- Hearing Loss and Ringing in the Ears. Not Present- Earache, Hoarseness, Nose Bleed, Oral Ulcers, Seasonal Allergies, Sinus Pain, Sore Throat, Visual Disturbances, Wears glasses/contact lenses and Yellow Eyes. Respiratory Not Present- Bloody sputum, Chronic Cough, Difficulty Breathing, Snoring and Wheezing. Breast Present- Breast Pain. Not Present- Breast Mass, Nipple Discharge and Skin Changes. Cardiovascular Not Present- Chest Pain, Difficulty Breathing Lying Down, Leg Cramps, Palpitations, Rapid Heart Rate, Shortness of Breath and Swelling of Extremities. Gastrointestinal Present- Change in Bowel Habits. Not Present- Abdominal Pain, Bloating, Bloody Stool, Chronic diarrhea, Constipation, Difficulty Swallowing, Excessive gas, Gets full quickly at meals, Hemorrhoids, Indigestion, Nausea, Rectal Pain and Vomiting. Female Genitourinary Not Present- Frequency, Nocturia, Painful Urination, Pelvic Pain and Urgency. Musculoskeletal Present- Muscle Weakness. Not Present- Back Pain, Joint Pain, Joint Stiffness, Muscle Pain and Swelling of Extremities. Neurological Present- Decreased Memory. Not Present- Fainting, Headaches,  Numbness, Seizures, Tingling, Tremor, Trouble walking and Weakness. Psychiatric Present- Depression. Not Present- Anxiety, Bipolar, Change in Sleep Pattern, Fearful and Frequent crying. Endocrine Not Present- Cold Intolerance, Excessive Hunger, Hair Changes, Heat Intolerance, Hot flashes and New Diabetes. Hematology Not Present- Easy Bruising, Excessive bleeding, Gland problems,  HIV and Persistent Infections.  Vitals (Sonya Bynum CMA; 12/19/2015 2:06 PM) 12/19/2015 2:05 PM Weight: 168 lb Height: 65in Body Surface Area: 1.84 m Body Mass Index: 27.96 kg/m  Temp.: 38F(Temporal)  Pulse: 76 (Regular)  BP: 124/74 (Sitting, Left Arm, Standard)       Physical Exam (Gracelyn Coventry A. Ninfa Linden MD; 12/19/2015 2:37 PM) General Mental Status-Alert. General Appearance-Consistent with stated age. Hydration-Well hydrated. Voice-Normal.  Head and Neck Head-normocephalic, atraumatic with no lesions or palpable masses. Trachea-midline. Thyroid Gland Characteristics - normal size and consistency.  Eye Eyeball - Bilateral-Extraocular movements intact. Sclera/Conjunctiva - Bilateral-No scleral icterus.  Chest and Lung Exam Chest and lung exam reveals -quiet, even and easy respiratory effort with no use of accessory muscles and on auscultation, normal breath sounds, no adventitious sounds and normal vocal resonance. Inspection Chest Wall - Normal. Back - normal.  Breast Breast - Left-Symmetric and Tender, No Biopsy scars, No Dimpling, No Inflammation, No Lumpectomy scars, No Mastectomy scars, No Peau d' Orange. Breast - Right-Symmetric and Tender, No Biopsy scars, No Dimpling, No Inflammation, No Lumpectomy scars, No Mastectomy scars, No Peau d' Orange. Note: I can palpate a small hematoma at the 10 o'clock position from the previous biopsy Breast Lump-No Palpable Breast Mass.  Cardiovascular Cardiovascular examination reveals -normal heart sounds, regular rate  and rhythm with no murmurs and normal pedal pulses bilaterally.  Abdomen Inspection Inspection of the abdomen reveals - No Hernias. Skin - Scar - no surgical scars. Palpation/Percussion Palpation and Percussion of the abdomen reveal - Soft, Non Tender, No Rebound tenderness, No Rigidity (guarding) and No hepatosplenomegaly. Auscultation Auscultation of the abdomen reveals - Bowel sounds normal.  Neurologic - Did not examine.  Musculoskeletal Normal Exam - Left-Upper Extremity Strength Normal and Lower Extremity Strength Normal. Normal Exam - Right-Upper Extremity Strength Normal and Lower Extremity Strength Normal.  Lymphatic Head & Neck  General Head & Neck Lymphatics: Bilateral - Description - Normal. Axillary  General Axillary Region: Bilateral - Description - Normal. Tenderness - Non Tender. Femoral & Inguinal  Generalized Femoral & Inguinal Lymphatics: Bilateral - Description - Normal. Tenderness - Non Tender.    Assessment & Plan (Daneesha Quinteros A. 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 Lumpectomy is recommended of the right breast for histologic evaluation to rule out malignancy. I discussed radioactive seed guided right breast lobectomy with her in detail. I discussed the risks of surgery which includes but is not limited to bleeding, infection, injury to surrounding structures, need for further surgery, postoperative recovery, etc. She understands and wishes to proceed with surgery which will be scheduled

## 2016-01-16 ENCOUNTER — Ambulatory Visit
Admission: RE | Admit: 2016-01-16 | Discharge: 2016-01-16 | Disposition: A | Discharge status: Home / Self Care | Payer: BLUE CROSS/BLUE SHIELD | Source: Ambulatory Visit | Attending: Surgery | Admitting: Surgery

## 2016-01-16 ENCOUNTER — Encounter (HOSPITAL_BASED_OUTPATIENT_CLINIC_OR_DEPARTMENT_OTHER): Payer: Self-pay | Admitting: *Deleted

## 2016-01-16 ENCOUNTER — Ambulatory Visit (HOSPITAL_BASED_OUTPATIENT_CLINIC_OR_DEPARTMENT_OTHER)
Admission: RE | Admit: 2016-01-16 | Discharge: 2016-01-16 | Disposition: A | Discharge status: Home / Self Care | Payer: BLUE CROSS/BLUE SHIELD | Source: Ambulatory Visit | Attending: Surgery | Admitting: Surgery

## 2016-01-16 ENCOUNTER — Encounter (HOSPITAL_BASED_OUTPATIENT_CLINIC_OR_DEPARTMENT_OTHER)
Admission: RE | Disposition: A | Discharge status: Home / Self Care | Payer: Self-pay | Source: Ambulatory Visit | Attending: Surgery

## 2016-01-16 ENCOUNTER — Ambulatory Visit (HOSPITAL_BASED_OUTPATIENT_CLINIC_OR_DEPARTMENT_OTHER): Payer: BLUE CROSS/BLUE SHIELD | Admitting: Certified Registered"

## 2016-01-16 DIAGNOSIS — N6011 Diffuse cystic mastopathy of right breast: Secondary | ICD-10-CM | POA: Diagnosis not present

## 2016-01-16 DIAGNOSIS — F329 Major depressive disorder, single episode, unspecified: Secondary | ICD-10-CM | POA: Insufficient documentation

## 2016-01-16 DIAGNOSIS — D241 Benign neoplasm of right breast: Secondary | ICD-10-CM

## 2016-01-16 DIAGNOSIS — R921 Mammographic calcification found on diagnostic imaging of breast: Secondary | ICD-10-CM | POA: Diagnosis not present

## 2016-01-16 DIAGNOSIS — N6081 Other benign mammary dysplasias of right breast: Secondary | ICD-10-CM | POA: Diagnosis not present

## 2016-01-16 DIAGNOSIS — R928 Other abnormal and inconclusive findings on diagnostic imaging of breast: Secondary | ICD-10-CM | POA: Diagnosis not present

## 2016-01-16 HISTORY — PX: BREAST LUMPECTOMY WITH RADIOACTIVE SEED LOCALIZATION: SHX6424

## 2016-01-16 SURGERY — BREAST LUMPECTOMY WITH RADIOACTIVE SEED LOCALIZATION
Anesthesia: General | Site: Breast | Laterality: Right

## 2016-01-16 MED ORDER — FENTANYL CITRATE (PF) 100 MCG/2ML IJ SOLN: 25.0000 ug | INTRAMUSCULAR | Status: DC | PRN

## 2016-01-16 MED ORDER — SODIUM CHLORIDE 0.9 % IV SOLN: 250.0000 mL | INTRAVENOUS | Status: DC | PRN

## 2016-01-16 MED ORDER — SODIUM CHLORIDE 0.9% FLUSH: 3.0000 mL | INTRAVENOUS | Status: DC | PRN

## 2016-01-16 MED ORDER — PROPOFOL 10 MG/ML IV BOLUS
INTRAVENOUS | Status: DC | PRN
  Administered 2016-01-16: 150 mg via INTRAVENOUS

## 2016-01-16 MED ORDER — MIDAZOLAM HCL 2 MG/2ML IJ SOLN
INTRAMUSCULAR | Status: AC
  Filled 2016-01-16: qty 2

## 2016-01-16 MED ORDER — ACETAMINOPHEN 325 MG PO TABS: 650.0000 mg | ORAL_TABLET | ORAL | Status: DC | PRN

## 2016-01-16 MED ORDER — LIDOCAINE 2% (20 MG/ML) 5 ML SYRINGE
INTRAMUSCULAR | Status: DC | PRN
  Administered 2016-01-16: 80 mg via INTRAVENOUS

## 2016-01-16 MED ORDER — EPHEDRINE SULFATE 50 MG/ML IJ SOLN
INTRAMUSCULAR | Status: DC | PRN
  Administered 2016-01-16 (×2): 5 mg via INTRAVENOUS
  Administered 2016-01-16: 10 mg via INTRAVENOUS

## 2016-01-16 MED ORDER — FENTANYL CITRATE (PF) 100 MCG/2ML IJ SOLN
50.0000 ug | INTRAMUSCULAR | Status: DC | PRN
  Administered 2016-01-16 (×2): 50 ug via INTRAVENOUS

## 2016-01-16 MED ORDER — CEFAZOLIN SODIUM-DEXTROSE 2-4 GM/100ML-% IV SOLN
2.0000 g | INTRAVENOUS | Status: AC
  Administered 2016-01-16: 2 g via INTRAVENOUS

## 2016-01-16 MED ORDER — DEXAMETHASONE SODIUM PHOSPHATE 4 MG/ML IJ SOLN
INTRAMUSCULAR | Status: DC | PRN
  Administered 2016-01-16: 10 mg via INTRAVENOUS

## 2016-01-16 MED ORDER — CEFAZOLIN SODIUM-DEXTROSE 2-4 GM/100ML-% IV SOLN
INTRAVENOUS | Status: AC
  Filled 2016-01-16: qty 100

## 2016-01-16 MED ORDER — BUPIVACAINE-EPINEPHRINE 0.5% -1:200000 IJ SOLN
INTRAMUSCULAR | Status: DC | PRN
  Administered 2016-01-16: 6 mL

## 2016-01-16 MED ORDER — GLYCOPYRROLATE 0.2 MG/ML IJ SOLN: 0.2000 mg | Freq: Once | INTRAMUSCULAR | Status: DC | PRN

## 2016-01-16 MED ORDER — SODIUM CHLORIDE 0.9% FLUSH: 3.0000 mL | Freq: Two times a day (BID) | INTRAVENOUS | Status: DC

## 2016-01-16 MED ORDER — ACETAMINOPHEN 650 MG RE SUPP: 650.0000 mg | RECTAL | Status: DC | PRN

## 2016-01-16 MED ORDER — OXYCODONE HCL 5 MG PO TABS: 5.0000 mg | ORAL_TABLET | Freq: Once | ORAL | Status: DC | PRN

## 2016-01-16 MED ORDER — BUPIVACAINE-EPINEPHRINE (PF) 0.5% -1:200000 IJ SOLN
INTRAMUSCULAR | Status: AC
  Filled 2016-01-16: qty 30

## 2016-01-16 MED ORDER — OXYCODONE HCL 5 MG PO TABS: 5.0000 mg | ORAL_TABLET | ORAL | Status: DC | PRN

## 2016-01-16 MED ORDER — LACTATED RINGERS IV SOLN
INTRAVENOUS | Status: DC
  Administered 2016-01-16: 08:00:00 via INTRAVENOUS

## 2016-01-16 MED ORDER — OXYCODONE HCL 5 MG/5ML PO SOLN: 5.0000 mg | Freq: Once | ORAL | Status: DC | PRN

## 2016-01-16 MED ORDER — ONDANSETRON HCL 4 MG/2ML IJ SOLN: 4.0000 mg | Freq: Four times a day (QID) | INTRAMUSCULAR | Status: DC | PRN

## 2016-01-16 MED ORDER — ONDANSETRON HCL 4 MG/2ML IJ SOLN
INTRAMUSCULAR | Status: DC | PRN
  Administered 2016-01-16: 4 mg via INTRAVENOUS

## 2016-01-16 MED ORDER — FENTANYL CITRATE (PF) 100 MCG/2ML IJ SOLN
INTRAMUSCULAR | Status: AC
  Filled 2016-01-16: qty 2

## 2016-01-16 MED ORDER — OXYCODONE-ACETAMINOPHEN 5-325 MG PO TABS: 1.0000 | ORAL_TABLET | ORAL | Status: DC | PRN

## 2016-01-16 MED ORDER — MIDAZOLAM HCL 2 MG/2ML IJ SOLN: 1.0000 mg | INTRAMUSCULAR | Status: DC | PRN

## 2016-01-16 SURGICAL SUPPLY — 48 items
APPLIER CLIP 9.375 MED OPEN (MISCELLANEOUS)
APR CLP MED 9.3 20 MLT OPN (MISCELLANEOUS)
BINDER BREAST XLRG (GAUZE/BANDAGES/DRESSINGS) ×1 IMPLANT
BLADE HEX COATED 2.75 (ELECTRODE) ×2 IMPLANT
BLADE SURG 15 STRL LF DISP TIS (BLADE) ×1 IMPLANT
BLADE SURG 15 STRL SS (BLADE) ×2
CANISTER SUCT 1200ML W/VALVE (MISCELLANEOUS) IMPLANT
CHLORAPREP W/TINT 26ML (MISCELLANEOUS) ×2 IMPLANT
CLIP APPLIE 9.375 MED OPEN (MISCELLANEOUS) IMPLANT
CLIP TI WIDE RED SMALL 6 (CLIP) ×1 IMPLANT
COVER BACK TABLE 60X90IN (DRAPES) ×2 IMPLANT
COVER MAYO STAND STRL (DRAPES) ×2 IMPLANT
COVER PROBE W GEL 5X96 (DRAPES) ×2 IMPLANT
DECANTER SPIKE VIAL GLASS SM (MISCELLANEOUS) IMPLANT
DEVICE DUBIN W/COMP PLATE 8390 (MISCELLANEOUS) ×2 IMPLANT
DRAPE LAPAROTOMY 100X72 PEDS (DRAPES) ×2 IMPLANT
DRAPE UTILITY XL STRL (DRAPES) ×2 IMPLANT
ELECT REM PT RETURN 9FT ADLT (ELECTROSURGICAL) ×2
ELECTRODE REM PT RTRN 9FT ADLT (ELECTROSURGICAL) ×1 IMPLANT
GLOVE BIO SURGEON STRL SZ7 (GLOVE) ×1 IMPLANT
GLOVE BIOGEL PI IND STRL 7.5 (GLOVE) IMPLANT
GLOVE BIOGEL PI INDICATOR 7.5 (GLOVE) ×1
GLOVE EXAM NITRILE EXT CUFF MD (GLOVE) ×1 IMPLANT
GLOVE SURG SIGNA 7.5 PF LTX (GLOVE) ×2 IMPLANT
GLOVE SURG SS PI 7.5 STRL IVOR (GLOVE) ×1 IMPLANT
GOWN STRL REUS W/ TWL LRG LVL3 (GOWN DISPOSABLE) ×1 IMPLANT
GOWN STRL REUS W/ TWL XL LVL3 (GOWN DISPOSABLE) ×1 IMPLANT
GOWN STRL REUS W/TWL LRG LVL3 (GOWN DISPOSABLE) ×2
GOWN STRL REUS W/TWL XL LVL3 (GOWN DISPOSABLE) ×4
KIT MARKER MARGIN INK (KITS) ×2 IMPLANT
LIQUID BAND (GAUZE/BANDAGES/DRESSINGS) ×2 IMPLANT
NDL HYPO 25X1 1.5 SAFETY (NEEDLE) ×1 IMPLANT
NEEDLE HYPO 25X1 1.5 SAFETY (NEEDLE) ×2 IMPLANT
NS IRRIG 1000ML POUR BTL (IV SOLUTION) ×1 IMPLANT
PACK BASIN DAY SURGERY FS (CUSTOM PROCEDURE TRAY) ×2 IMPLANT
PENCIL BUTTON HOLSTER BLD 10FT (ELECTRODE) ×2 IMPLANT
SLEEVE SCD COMPRESS KNEE MED (MISCELLANEOUS) ×2 IMPLANT
SPONGE GAUZE 4X4 12PLY STER LF (GAUZE/BANDAGES/DRESSINGS) IMPLANT
SPONGE LAP 4X18 X RAY DECT (DISPOSABLE) ×2 IMPLANT
SUT MNCRL AB 4-0 PS2 18 (SUTURE) ×2 IMPLANT
SUT SILK 2 0 SH (SUTURE) IMPLANT
SUT VIC AB 3-0 SH 27 (SUTURE) ×2
SUT VIC AB 3-0 SH 27X BRD (SUTURE) ×1 IMPLANT
SYR CONTROL 10ML LL (SYRINGE) ×2 IMPLANT
TOWEL OR 17X24 6PK STRL BLUE (TOWEL DISPOSABLE) ×2 IMPLANT
TOWEL OR NON WOVEN STRL DISP B (DISPOSABLE) IMPLANT
TUBE CONNECTING 20X1/4 (TUBING) IMPLANT
YANKAUER SUCT BULB TIP NO VENT (SUCTIONS) IMPLANT

## 2016-01-16 NOTE — Op Note (Signed)
[name], [name] NO.:  [id number]  MEDICAL RECORD NO.:  [id number]  LOCATION:                                 FACILITY:  PHYSICIAN:  Coralie Keens, M.D. DATE OF BIRTH:  [birth year]  DATE OF PROCEDURE:  01/16/2016 DATE OF DISCHARGE:                              OPERATIVE REPORT   PREOPERATIVE DIAGNOSIS:  Right breast intraductal papilloma.  POSTOPERATIVE DIAGNOSIS:  Right breast intraductal papilloma.  PROCEDURE:  Radioactive seed localized right breast lumpectomy.  SURGEON:  Coralie Keens, M.D.  ANESTHESIA:  General with 0.5% Marcaine.  ESTIMATED BLOOD LOSS:  Minimal.  INDICATIONS:  This is a 60 year old female with abnormalities on mammogram of the right breast.  A stereotactic biopsy was performed showing a papilloma.  She also has a dilated duct.  The decision has been made to proceed with a radioactive seed localized right breast lumpectomy.  PROCEDURE IN DETAIL:  The patient was brought to the operating room, identified as Kelly Espinoza.  She had already been evaluated in the preop holding area and the radioactive seed was confirmed to be in the right breast.  She was placed supine on the operating room table and general anesthesia was induced.  Her right breast was then prepped and draped in the usual sterile fashion.  The seed was located at approximately the 10 o'clock position of the right breast.  I anesthetized the skin around the edge of the areola with Marcaine and then made a circumareolar incision with a scalpel.  I took this down to the breast tissue with electrocautery.  With the Neoprobe, I then performed a lumpectomy around the area of increased uptake.  I went down to just slightly above the chest wall.  Lumpectomy specimen was then completely removed.  I again confirmed that the seed was in the specimen with the Neoprobe and there was no further uptake in the breast.  I then marked the specimen with marker paint.  An  x-ray was performed confirming that the radioactive seed and previous marker were in the biopsy specimen.  I then achieved hemostasis with cautery.  The specimen was sent to Pathology for evaluation.  I placed one surgical clip into the biopsy cavity.  I then closed the subcutaneous tissue with interrupted 3-0 Vicryl sutures and closed the skin with a running 4-0 Monocryl.  Skin glue was then applied.  The patient tolerated the procedure well.  All counts were correct at the end of the procedure.  The patient was then extubated in the operating room and taken in a stable condition to recovery room.     Coralie Keens, M.D.   ______________________________ Coralie Keens, M.D.    DB/MEDQ  D:  01/16/2016  T:  01/16/2016  Job:  [id number]

## 2016-01-16 NOTE — Op Note (Signed)
RIGHT BREAST LUMPECTOMY WITH RADIOACTIVE SEED LOCALIZATION  Procedure Note  Kelly Espinoza 01/16/2016   Pre-op Diagnosis: right breast intraductal papilloma     Post-op Diagnosis: same  Procedure(s): RIGHT BREAST LUMPECTOMY WITH RADIOACTIVE SEED LOCALIZATION  Surgeon(s): Coralie Keens, MD  Anesthesia: General  Staff:  Circulator: Ted Mcalpine, RN Scrub Person: Bethel Born Ricks, RN  Estimated Blood Loss: Minimal               Specimens: sent to path          [facility]   Date: 01/16/2016  Time: 10:54 AM

## 2016-01-16 NOTE — Discharge Instructions (Signed)
[facility] °Office Phone Number [phone] ° °BREAST BIOPSY/ PARTIAL MASTECTOMY: POST OP INSTRUCTIONS ° °Always review your discharge instruction sheet given to you by the facility where your surgery was performed. ° °IF YOU HAVE DISABILITY OR FAMILY LEAVE FORMS, YOU MUST BRING THEM TO THE OFFICE FOR PROCESSING.  DO NOT GIVE THEM TO YOUR DOCTOR. ° °1. A prescription for pain medication may be given to you upon discharge.  Take your pain medication as prescribed, if needed.  If narcotic pain medicine is not needed, then you may take acetaminophen (Tylenol) or ibuprofen (Advil) as needed. °2. Take your usually prescribed medications unless otherwise directed °3. If you need a refill on your pain medication, please contact your pharmacy.  They will contact our office to request authorization.  Prescriptions will not be filled after 5pm or on week-ends. °4. You should eat very light the first 24 hours after surgery, such as soup, crackers, pudding, etc.  Resume your normal diet the day after surgery. °5. Most patients will experience some swelling and bruising in the breast.  Ice packs and a good support bra will help.  Swelling and bruising can take several days to resolve.  °6. It is common to experience some constipation if taking pain medication after surgery.  Increasing fluid intake and taking a stool softener will usually help or prevent this problem from occurring.  A mild laxative (Milk of Magnesia or Miralax) should be taken according to package directions if there are no bowel movements after 48 hours. °7. Unless discharge instructions indicate otherwise, you may remove your bandages 24-48 hours after surgery, and you may shower at that time.  You may have steri-strips (small skin tapes) in place directly over the incision.  These strips should be left on the skin for 7-10 days.  If your surgeon used skin glue on the incision, you may shower in 24 hours.  The glue will flake off over the  next 2-3 weeks.  Any sutures or staples will be removed at the office during your follow-up visit. °8. ACTIVITIES:  You may resume regular daily activities (gradually increasing) beginning the next day.  Wearing a good support bra or sports bra minimizes pain and swelling.  You may have sexual intercourse when it is comfortable. °a. You may drive when you no longer are taking prescription pain medication, you can comfortably wear a seatbelt, and you can safely maneuver your car and apply brakes. °b. RETURN TO WORK:  ______________________________________________________________________________________ °9. You should see your doctor in the office for a follow-up appointment approximately two weeks after your surgery.  Your doctor’s nurse will typically make your follow-up appointment when she calls you with your pathology report.  Expect your pathology report 2-3 business days after your surgery.  You may call to check if you do not hear from us after three days. °10. OTHER INSTRUCTIONS: _______________________________________________________________________________________________ _____________________________________________________________________________________________________________________________________ °_____________________________________________________________________________________________________________________________________ °_____________________________________________________________________________________________________________________________________ ° °WHEN TO CALL YOUR DOCTOR: °1. Fever over 101.0 °2. Nausea and/or vomiting. °3. Extreme swelling or bruising. °4. Continued bleeding from incision. °5. Increased pain, redness, or drainage from the incision. ° °The clinic staff is available to answer your questions during regular business hours.  Please don’t hesitate to call and ask to speak to one of the nurses for clinical concerns.  If you have a medical emergency, go to the nearest  emergency room or call 911.  A surgeon from [facility] is always on call at the hospital. ° °For further questions, please visit [email or web address]  ° ° ° °  Post Anesthesia Home Care Instructions ° °Activity: °Get plenty of rest for the remainder of the day. A responsible adult should stay with you for 24 hours following the procedure.  °For the next 24 hours, DO NOT: °-Drive a car °-Operate machinery °-Drink alcoholic beverages °-Take any medication unless instructed by your physician °-Make any legal decisions or sign important papers. ° °Meals: °Start with liquid foods such as gelatin or soup. Progress to regular foods as tolerated. Avoid greasy, spicy, heavy foods. If nausea and/or vomiting occur, drink only clear liquids until the nausea and/or vomiting subsides. Call your physician if vomiting continues. ° °Special Instructions/Symptoms: °Your throat may feel dry or sore from the anesthesia or the breathing tube placed in your throat during surgery. If this causes discomfort, gargle with warm salt water. The discomfort should disappear within 24 hours. ° °If you had a scopolamine patch placed behind your ear for the management of post- operative nausea and/or vomiting: ° °1. The medication in the patch is effective for 72 hours, after which it should be removed.  Wrap patch in a tissue and discard in the trash. Wash hands thoroughly with soap and water. °2. You may remove the patch earlier than 72 hours if you experience unpleasant side effects which may include dry mouth, dizziness or visual disturbances. °3. Avoid touching the patch. Wash your hands with soap and water after contact with the patch. °  ° °

## 2016-01-16 NOTE — Anesthesia Preprocedure Evaluation (Signed)
Anesthesia Evaluation  Patient identified by MRN, date of birth, ID band Patient awake    Reviewed: Allergy & Precautions, NPO status , Patient's Chart, lab work & pertinent test results  Airway Mallampati: II   Neck ROM: full    Dental   Pulmonary neg pulmonary ROS,    breath sounds clear to auscultation       Cardiovascular negative cardio ROS   Rhythm:regular Rate:Normal     Neuro/Psych Depression    GI/Hepatic   Endo/Other    Renal/GU      Musculoskeletal   Abdominal   Peds  Hematology   Anesthesia Other Findings   Reproductive/Obstetrics                             Anesthesia Physical Anesthesia Plan  ASA: II  Anesthesia Plan: General   Post-op Pain Management:    Induction: Intravenous  Airway Management Planned: LMA  Additional Equipment:   Intra-op Plan:   Post-operative Plan:   Informed Consent: I have reviewed the patients History and Physical, chart, labs and discussed the procedure including the risks, benefits and alternatives for the proposed anesthesia with the patient or authorized representative who has indicated his/her understanding and acceptance.     Plan Discussed with: CRNA, Anesthesiologist and Surgeon  Anesthesia Plan Comments:         Anesthesia Quick Evaluation

## 2016-01-16 NOTE — Transfer of Care (Signed)
Immediate Anesthesia Transfer of Care Note  Patient: Kelly Espinoza  Procedure(s) Performed: Procedure(s): RIGHT BREAST LUMPECTOMY WITH RADIOACTIVE SEED LOCALIZATION (Right)  Patient Location: PACU  Anesthesia Type:General  Level of Consciousness: awake, alert  and patient cooperative  Airway & Oxygen Therapy: Patient Spontanous Breathing and Patient connected to face mask oxygen  Post-op Assessment: Report given to RN, Post -op Vital signs reviewed and stable and Patient moving all extremities  Post vital signs: Reviewed and stable  Last Vitals:  Filed Vitals:   01/16/16 0810  BP: 115/55  Pulse: 87  Temp: 36.6 C  Resp: 20    Last Pain: There were no vitals filed for this visit.       Complications: No apparent anesthesia complications

## 2016-01-16 NOTE — Interval H&P Note (Signed)
History and Physical Interval Note:no change in H and P  01/16/2016 9:57 AM  Kelly Espinoza  has presented today for surgery, with the diagnosis of right breast intraductal papilloma  The various methods of treatment have been discussed with the patient and family. After consideration of risks, benefits and other options for treatment, the patient has consented to  Procedure(s): RIGHT BREAST LUMPECTOMY WITH RADIOACTIVE SEED LOCALIZATION (Right) as a surgical intervention .  The patient's history has been reviewed, patient examined, no change in status, stable for surgery.  I have reviewed the patient's chart and labs.  Questions were answered to the patient's satisfaction.     Fergie Sherbert A

## 2016-01-16 NOTE — Anesthesia Postprocedure Evaluation (Signed)
Anesthesia Post Note  Patient: Kelly Espinoza  Procedure(s) Performed: Procedure(s) (LRB): RIGHT BREAST LUMPECTOMY WITH RADIOACTIVE SEED LOCALIZATION (Right)  Patient location during evaluation: PACU Anesthesia Type: General Level of consciousness: awake and alert and patient cooperative Pain management: pain level controlled Vital Signs Assessment: post-procedure vital signs reviewed and stable Respiratory status: spontaneous breathing and respiratory function stable Cardiovascular status: stable Anesthetic complications: no    Last Vitals:  Filed Vitals:   01/16/16 1145 01/16/16 1200  BP:  122/73  Pulse: 77 80  Temp:  36.4 C  Resp: 12 18    Last Pain:  Filed Vitals:   01/16/16 1201  PainSc: 0-No pain                 Rosalba Totty S

## 2016-01-16 NOTE — Anesthesia Procedure Notes (Signed)
Procedure Name: LMA Insertion Date/Time: 01/16/2016 10:25 AM Performed by: Baxter Flattery Pre-anesthesia Checklist: Patient identified, Emergency Drugs available, Suction available and Patient being monitored Patient Re-evaluated:Patient Re-evaluated prior to inductionOxygen Delivery Method: Circle system utilized Preoxygenation: Pre-oxygenation with 100% oxygen Intubation Type: IV induction Ventilation: Mask ventilation without difficulty LMA: LMA inserted LMA Size: 4.0 Number of attempts: 1 Airway Equipment and Method: Bite block Placement Confirmation: positive ETCO2 and breath sounds checked- equal and bilateral Tube secured with: Tape Dental Injury: Teeth and Oropharynx as per pre-operative assessment

## 2016-01-19 ENCOUNTER — Encounter (HOSPITAL_BASED_OUTPATIENT_CLINIC_OR_DEPARTMENT_OTHER): Payer: Self-pay | Admitting: Surgery

## 2016-03-28 DIAGNOSIS — M79671 Pain in right foot: Secondary | ICD-10-CM | POA: Diagnosis not present

## 2016-07-17 DIAGNOSIS — F329 Major depressive disorder, single episode, unspecified: Secondary | ICD-10-CM | POA: Diagnosis not present

## 2016-08-03 DIAGNOSIS — F329 Major depressive disorder, single episode, unspecified: Secondary | ICD-10-CM | POA: Diagnosis not present

## 2016-08-17 DIAGNOSIS — F329 Major depressive disorder, single episode, unspecified: Secondary | ICD-10-CM | POA: Diagnosis not present

## 2016-08-20 DIAGNOSIS — F329 Major depressive disorder, single episode, unspecified: Secondary | ICD-10-CM | POA: Diagnosis not present

## 2016-09-21 DIAGNOSIS — F329 Major depressive disorder, single episode, unspecified: Secondary | ICD-10-CM | POA: Diagnosis not present

## 2016-10-12 DIAGNOSIS — F329 Major depressive disorder, single episode, unspecified: Secondary | ICD-10-CM | POA: Diagnosis not present

## 2016-10-19 DIAGNOSIS — F329 Major depressive disorder, single episode, unspecified: Secondary | ICD-10-CM | POA: Diagnosis not present

## 2016-11-02 DIAGNOSIS — F329 Major depressive disorder, single episode, unspecified: Secondary | ICD-10-CM | POA: Diagnosis not present

## 2016-11-09 DIAGNOSIS — F329 Major depressive disorder, single episode, unspecified: Secondary | ICD-10-CM | POA: Diagnosis not present

## 2017-01-20 DIAGNOSIS — F329 Major depressive disorder, single episode, unspecified: Secondary | ICD-10-CM | POA: Diagnosis not present

## 2017-03-29 DIAGNOSIS — E782 Mixed hyperlipidemia: Secondary | ICD-10-CM | POA: Diagnosis not present

## 2017-04-02 ENCOUNTER — Other Ambulatory Visit (HOSPITAL_COMMUNITY): Payer: Self-pay | Admitting: Internal Medicine

## 2017-04-02 DIAGNOSIS — Z9889 Other specified postprocedural states: Secondary | ICD-10-CM

## 2017-04-06 ENCOUNTER — Encounter (HOSPITAL_COMMUNITY): Payer: Self-pay

## 2017-04-06 ENCOUNTER — Ambulatory Visit (HOSPITAL_COMMUNITY)
Admission: RE | Admit: 2017-04-06 | Discharge: 2017-04-06 | Disposition: A | Discharge status: Home / Self Care | Payer: BLUE CROSS/BLUE SHIELD | Source: Ambulatory Visit | Attending: Internal Medicine | Admitting: Internal Medicine

## 2017-04-06 DIAGNOSIS — R922 Inconclusive mammogram: Secondary | ICD-10-CM | POA: Diagnosis not present

## 2017-04-06 DIAGNOSIS — Z9889 Other specified postprocedural states: Secondary | ICD-10-CM | POA: Insufficient documentation

## 2017-07-13 DIAGNOSIS — Z5181 Encounter for therapeutic drug level monitoring: Secondary | ICD-10-CM | POA: Diagnosis not present

## 2017-08-05 DIAGNOSIS — M545 Low back pain: Secondary | ICD-10-CM | POA: Diagnosis not present

## 2017-08-05 DIAGNOSIS — Z6824 Body mass index (BMI) 24.0-24.9, adult: Secondary | ICD-10-CM | POA: Diagnosis not present

## 2017-08-05 DIAGNOSIS — J069 Acute upper respiratory infection, unspecified: Secondary | ICD-10-CM | POA: Diagnosis not present

## 2017-08-05 DIAGNOSIS — Z5181 Encounter for therapeutic drug level monitoring: Secondary | ICD-10-CM | POA: Diagnosis not present

## 2017-08-05 DIAGNOSIS — R070 Pain in throat: Secondary | ICD-10-CM | POA: Diagnosis not present

## 2017-08-05 DIAGNOSIS — F331 Major depressive disorder, recurrent, moderate: Secondary | ICD-10-CM | POA: Diagnosis not present

## 2017-08-11 ENCOUNTER — Other Ambulatory Visit (HOSPITAL_COMMUNITY): Payer: Self-pay | Admitting: Adult Health Nurse Practitioner

## 2017-08-11 ENCOUNTER — Ambulatory Visit (HOSPITAL_COMMUNITY)
Admission: RE | Admit: 2017-08-11 | Discharge: 2017-08-11 | Disposition: A | Discharge status: Home / Self Care | Payer: BLUE CROSS/BLUE SHIELD | Source: Ambulatory Visit | Attending: Adult Health Nurse Practitioner | Admitting: Adult Health Nurse Practitioner

## 2017-08-11 DIAGNOSIS — R109 Unspecified abdominal pain: Secondary | ICD-10-CM | POA: Diagnosis not present

## 2017-08-11 DIAGNOSIS — R809 Proteinuria, unspecified: Secondary | ICD-10-CM

## 2017-08-11 DIAGNOSIS — R1031 Right lower quadrant pain: Secondary | ICD-10-CM

## 2017-08-11 DIAGNOSIS — R509 Fever, unspecified: Secondary | ICD-10-CM | POA: Insufficient documentation

## 2017-08-11 DIAGNOSIS — I959 Hypotension, unspecified: Secondary | ICD-10-CM | POA: Insufficient documentation

## 2017-08-11 DIAGNOSIS — N390 Urinary tract infection, site not specified: Secondary | ICD-10-CM | POA: Diagnosis not present

## 2017-08-26 DIAGNOSIS — R300 Dysuria: Secondary | ICD-10-CM | POA: Diagnosis not present

## 2017-08-26 DIAGNOSIS — N390 Urinary tract infection, site not specified: Secondary | ICD-10-CM | POA: Diagnosis not present

## 2017-12-11 DIAGNOSIS — R531 Weakness: Secondary | ICD-10-CM | POA: Diagnosis not present

## 2017-12-11 DIAGNOSIS — R5383 Other fatigue: Secondary | ICD-10-CM | POA: Diagnosis not present

## 2017-12-11 DIAGNOSIS — F331 Major depressive disorder, recurrent, moderate: Secondary | ICD-10-CM | POA: Diagnosis not present

## 2017-12-11 DIAGNOSIS — H53149 Visual discomfort, unspecified: Secondary | ICD-10-CM | POA: Diagnosis not present

## 2017-12-11 DIAGNOSIS — R110 Nausea: Secondary | ICD-10-CM | POA: Diagnosis not present

## 2017-12-11 DIAGNOSIS — Z6822 Body mass index (BMI) 22.0-22.9, adult: Secondary | ICD-10-CM | POA: Diagnosis not present

## 2017-12-11 DIAGNOSIS — Z6824 Body mass index (BMI) 24.0-24.9, adult: Secondary | ICD-10-CM | POA: Diagnosis not present

## 2017-12-13 DIAGNOSIS — K5900 Constipation, unspecified: Secondary | ICD-10-CM | POA: Diagnosis not present

## 2017-12-13 DIAGNOSIS — R1030 Lower abdominal pain, unspecified: Secondary | ICD-10-CM | POA: Diagnosis not present

## 2017-12-13 DIAGNOSIS — Z6822 Body mass index (BMI) 22.0-22.9, adult: Secondary | ICD-10-CM | POA: Diagnosis not present

## 2017-12-13 DIAGNOSIS — R42 Dizziness and giddiness: Secondary | ICD-10-CM | POA: Diagnosis not present

## 2018-01-19 DIAGNOSIS — R110 Nausea: Secondary | ICD-10-CM | POA: Diagnosis not present

## 2018-01-19 DIAGNOSIS — R531 Weakness: Secondary | ICD-10-CM | POA: Diagnosis not present

## 2018-01-19 DIAGNOSIS — H53149 Visual discomfort, unspecified: Secondary | ICD-10-CM | POA: Diagnosis not present

## 2018-01-19 DIAGNOSIS — R1030 Lower abdominal pain, unspecified: Secondary | ICD-10-CM | POA: Diagnosis not present

## 2018-01-26 DIAGNOSIS — R531 Weakness: Secondary | ICD-10-CM | POA: Diagnosis not present

## 2018-01-26 DIAGNOSIS — R1030 Lower abdominal pain, unspecified: Secondary | ICD-10-CM | POA: Diagnosis not present

## 2018-01-26 DIAGNOSIS — N390 Urinary tract infection, site not specified: Secondary | ICD-10-CM | POA: Diagnosis not present

## 2018-01-31 DIAGNOSIS — Z0001 Encounter for general adult medical examination with abnormal findings: Secondary | ICD-10-CM | POA: Diagnosis not present

## 2018-01-31 DIAGNOSIS — E782 Mixed hyperlipidemia: Secondary | ICD-10-CM | POA: Diagnosis not present

## 2018-01-31 DIAGNOSIS — I10 Essential (primary) hypertension: Secondary | ICD-10-CM | POA: Diagnosis not present

## 2018-03-16 DIAGNOSIS — M9901 Segmental and somatic dysfunction of cervical region: Secondary | ICD-10-CM | POA: Diagnosis not present

## 2018-03-16 DIAGNOSIS — M546 Pain in thoracic spine: Secondary | ICD-10-CM | POA: Diagnosis not present

## 2018-03-16 DIAGNOSIS — M542 Cervicalgia: Secondary | ICD-10-CM | POA: Diagnosis not present

## 2018-03-16 DIAGNOSIS — M9902 Segmental and somatic dysfunction of thoracic region: Secondary | ICD-10-CM | POA: Diagnosis not present

## 2018-03-18 DIAGNOSIS — M546 Pain in thoracic spine: Secondary | ICD-10-CM | POA: Diagnosis not present

## 2018-03-18 DIAGNOSIS — M9901 Segmental and somatic dysfunction of cervical region: Secondary | ICD-10-CM | POA: Diagnosis not present

## 2018-03-18 DIAGNOSIS — M9902 Segmental and somatic dysfunction of thoracic region: Secondary | ICD-10-CM | POA: Diagnosis not present

## 2018-03-22 DIAGNOSIS — M9902 Segmental and somatic dysfunction of thoracic region: Secondary | ICD-10-CM | POA: Diagnosis not present

## 2018-03-22 DIAGNOSIS — M542 Cervicalgia: Secondary | ICD-10-CM | POA: Diagnosis not present

## 2018-03-22 DIAGNOSIS — M9901 Segmental and somatic dysfunction of cervical region: Secondary | ICD-10-CM | POA: Diagnosis not present

## 2018-03-25 DIAGNOSIS — M9901 Segmental and somatic dysfunction of cervical region: Secondary | ICD-10-CM | POA: Diagnosis not present

## 2018-03-25 DIAGNOSIS — M9902 Segmental and somatic dysfunction of thoracic region: Secondary | ICD-10-CM | POA: Diagnosis not present

## 2018-03-25 DIAGNOSIS — M546 Pain in thoracic spine: Secondary | ICD-10-CM | POA: Diagnosis not present

## 2018-03-25 DIAGNOSIS — M542 Cervicalgia: Secondary | ICD-10-CM | POA: Diagnosis not present

## 2018-05-13 ENCOUNTER — Other Ambulatory Visit (HOSPITAL_COMMUNITY): Payer: Self-pay | Admitting: Internal Medicine

## 2018-05-13 DIAGNOSIS — Z1231 Encounter for screening mammogram for malignant neoplasm of breast: Secondary | ICD-10-CM

## 2018-05-25 ENCOUNTER — Ambulatory Visit (HOSPITAL_COMMUNITY): Payer: BLUE CROSS/BLUE SHIELD

## 2018-06-06 ENCOUNTER — Ambulatory Visit (HOSPITAL_COMMUNITY)
Admission: RE | Admit: 2018-06-06 | Discharge: 2018-06-06 | Disposition: A | Discharge status: Home / Self Care | Payer: BLUE CROSS/BLUE SHIELD | Source: Ambulatory Visit | Attending: Internal Medicine | Admitting: Internal Medicine

## 2018-06-06 DIAGNOSIS — Z1231 Encounter for screening mammogram for malignant neoplasm of breast: Secondary | ICD-10-CM | POA: Diagnosis not present

## 2018-07-18 DIAGNOSIS — N390 Urinary tract infection, site not specified: Secondary | ICD-10-CM | POA: Diagnosis not present

## 2018-07-18 DIAGNOSIS — R1030 Lower abdominal pain, unspecified: Secondary | ICD-10-CM | POA: Diagnosis not present

## 2018-07-18 DIAGNOSIS — R110 Nausea: Secondary | ICD-10-CM | POA: Diagnosis not present

## 2018-07-18 DIAGNOSIS — R2242 Localized swelling, mass and lump, left lower limb: Secondary | ICD-10-CM | POA: Diagnosis not present

## 2019-02-13 DIAGNOSIS — F39 Unspecified mood [affective] disorder: Secondary | ICD-10-CM | POA: Diagnosis not present

## 2019-02-13 DIAGNOSIS — G4700 Insomnia, unspecified: Secondary | ICD-10-CM | POA: Diagnosis not present

## 2019-03-16 DIAGNOSIS — R531 Weakness: Secondary | ICD-10-CM | POA: Diagnosis not present

## 2019-03-18 IMAGING — MG DIGITAL SCREENING BILATERAL MAMMOGRAM WITH TOMO AND CAD
8 series · 8 of 24 positions shown · non-contrast
Comparison: Previous exam(s).

CLINICAL DATA: Screening.

EXAM:
DIGITAL SCREENING BILATERAL MAMMOGRAM WITH TOMO AND CAD

[R CC synth-2D]
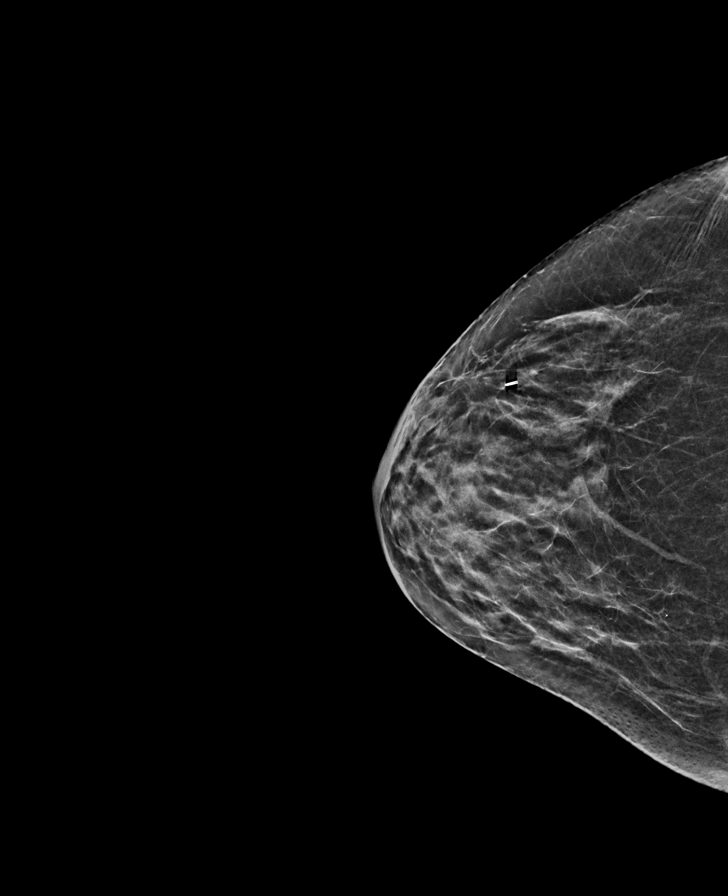

[L MLO synth-2D]
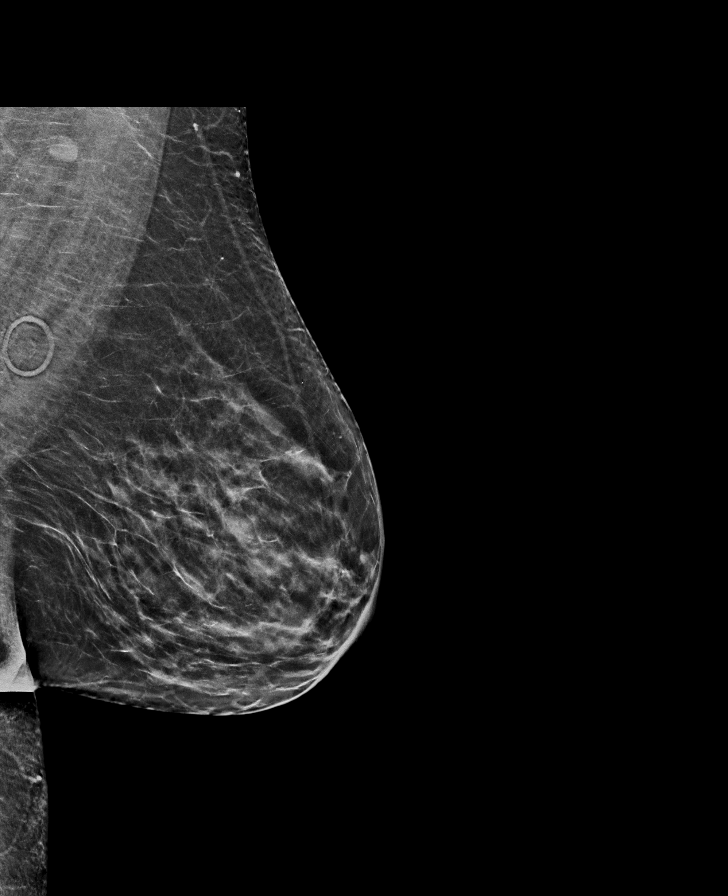

[R MLO synth-2D]
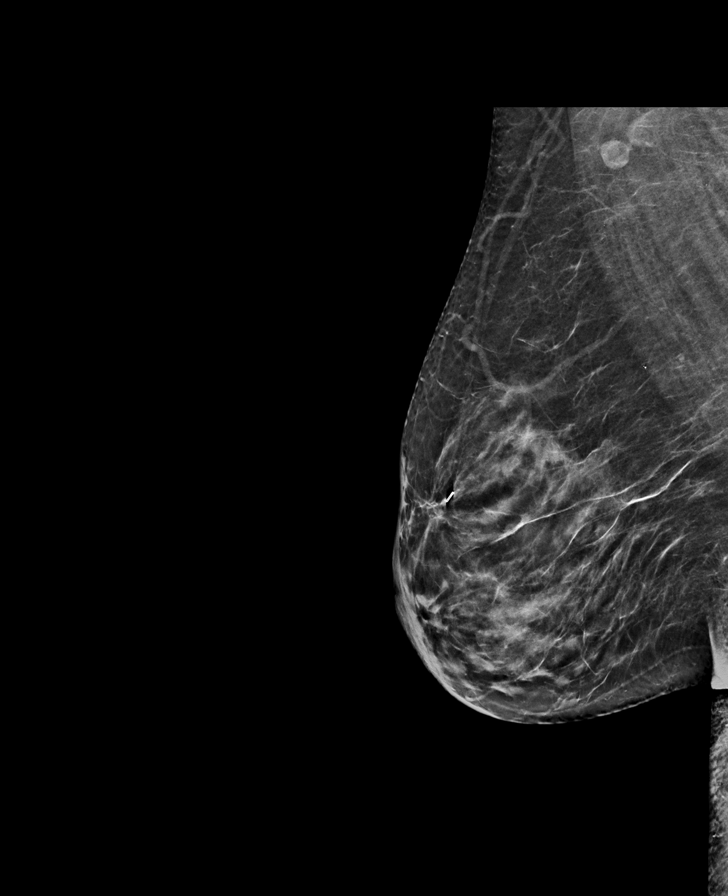

[L CC synth-2D]
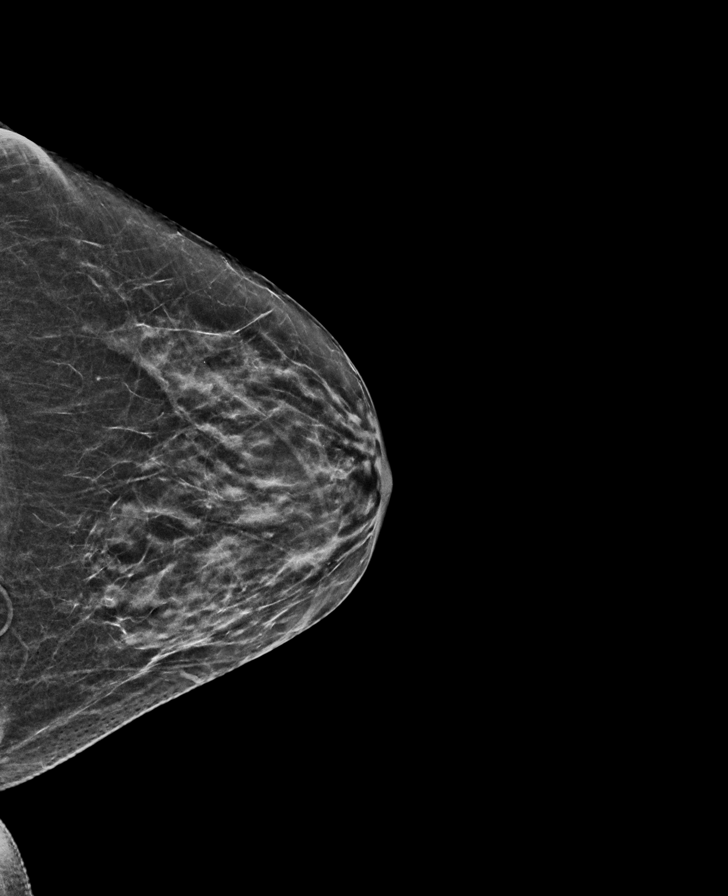

[L CC tomo · tomo slice 27/52.0]
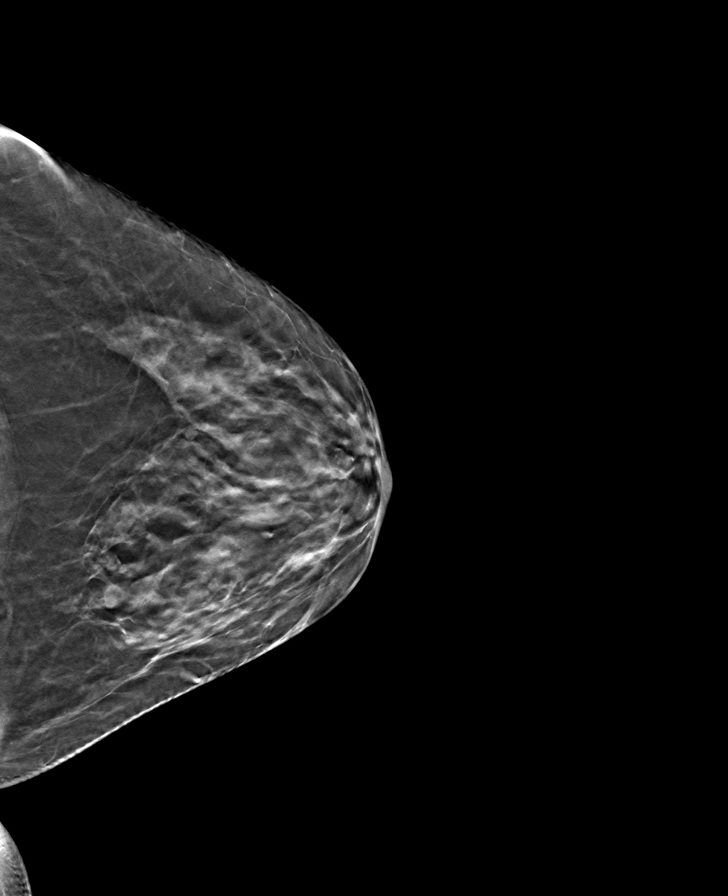

[R CC tomo · tomo slice 26/51.0]
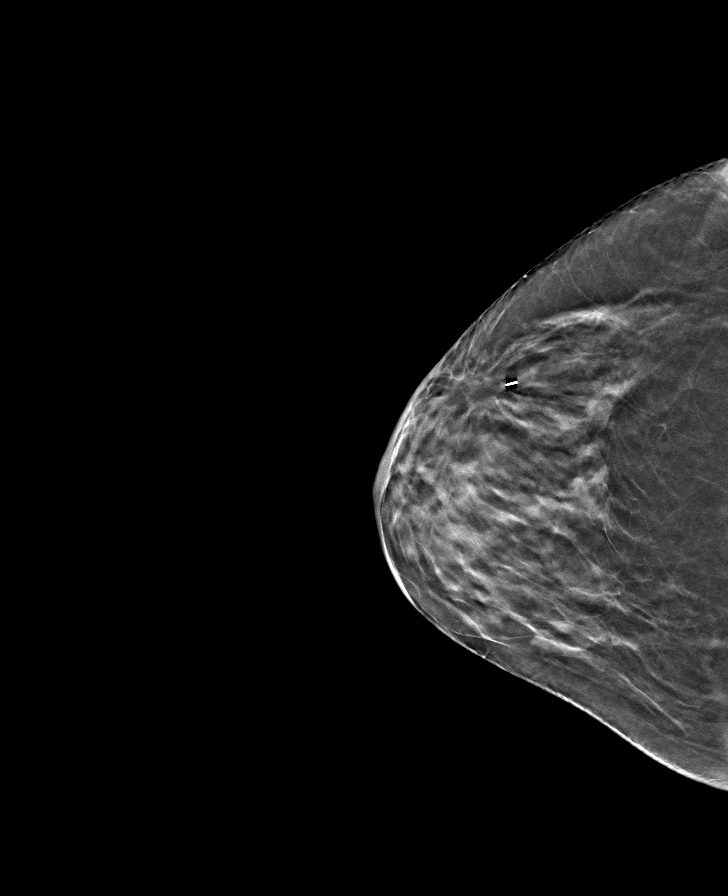

[R MLO tomo · tomo slice 31/60.0]
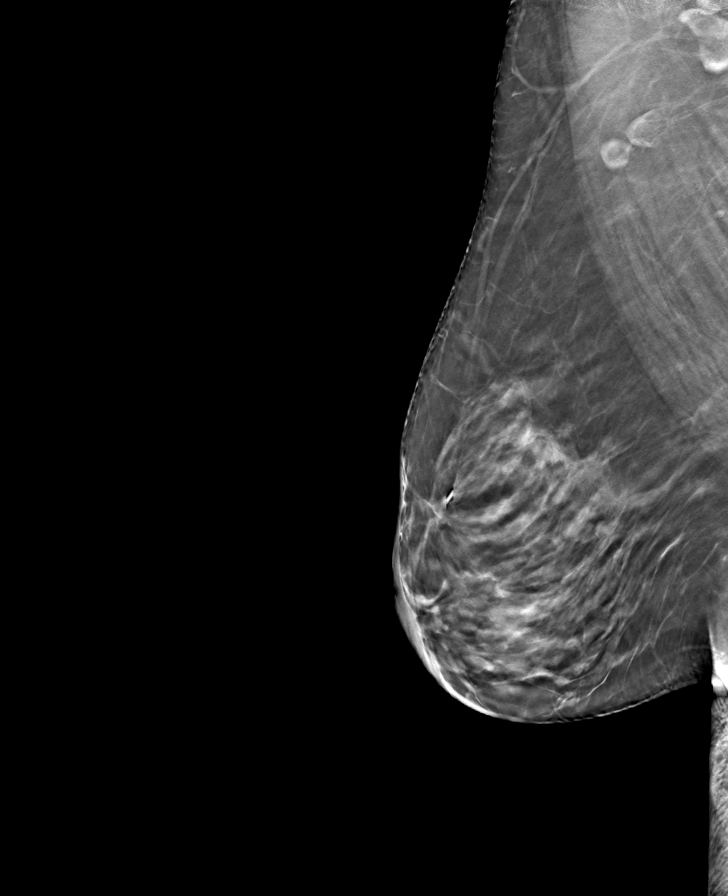

[L MLO tomo · tomo slice 31/60.0]
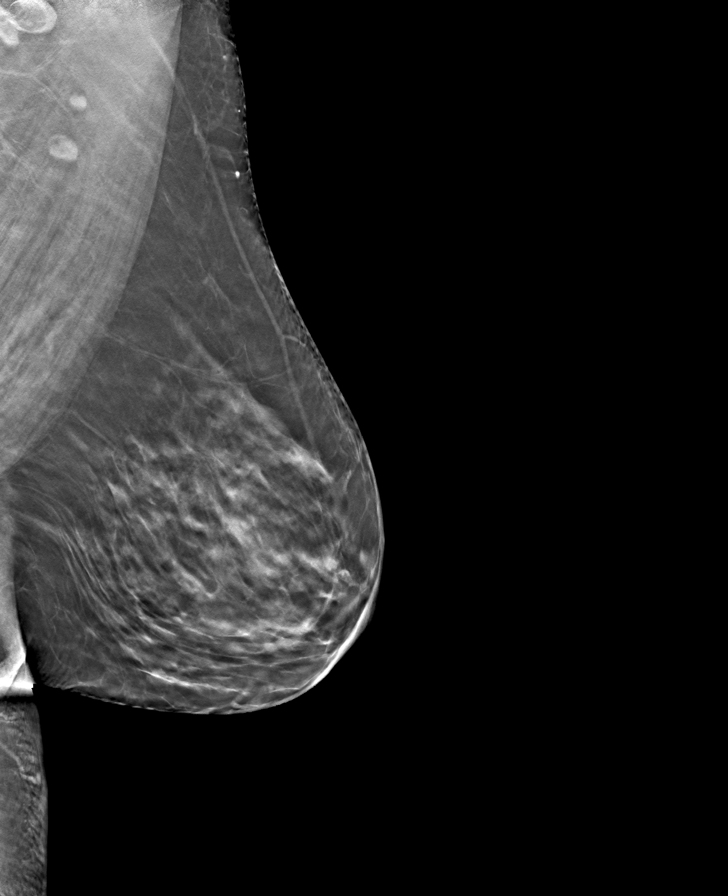

[8 of 24 positions shown; findings below may reference images not displayed]

ACR Breast Density Category c: The breast tissue is heterogeneously
dense, which may obscure small masses.
FINDINGS: There are no findings suspicious for malignancy. Images were
processed with CAD.
IMPRESSION: No mammographic evidence of malignancy. A result letter of this
screening mammogram will be mailed directly to the patient.

RECOMMENDATION:
Screening mammogram in one year. (Code:FT-U-LHB)

BI-RADS CATEGORY  1: Negative.

## 2020-03-13 ENCOUNTER — Ambulatory Visit
Admission: EM | Admit: 2020-03-13 | Discharge: 2020-03-13 | Disposition: A | Discharge status: Home / Self Care | Payer: 59

## 2020-03-13 ENCOUNTER — Other Ambulatory Visit: Payer: Self-pay

## 2020-03-13 ENCOUNTER — Encounter: Payer: Self-pay | Admitting: Emergency Medicine

## 2020-03-13 DIAGNOSIS — R5383 Other fatigue: Secondary | ICD-10-CM

## 2020-03-13 DIAGNOSIS — S0003XA Contusion of scalp, initial encounter: Secondary | ICD-10-CM | POA: Diagnosis not present
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
## 2020-03-13 NOTE — ED Triage Notes (Signed)
Pt noticed knot on top of her head last night, denies any injury.  Pt also reports having extreme fatigue for years that has gotten worse lately. Has been seen by Dr Nevada Crane for the fatigue and was told everything is fine.

## 2020-03-13 NOTE — Discharge Instructions (Addendum)
Apply ice to scalp and monitor   Blood work pending- we will call if abnormal  Follow up with primary care for fatigue

## 2020-03-14 LAB — BASIC METABOLIC PANEL
BUN/Creatinine Ratio: 34 — ABNORMAL HIGH (ref 12–28)
BUN: 20 mg/dL (ref 8–27)
CO2: 20 mmol/L (ref 20–29)
Calcium: 9.7 mg/dL (ref 8.7–10.3)
Chloride: 108 mmol/L — ABNORMAL HIGH (ref 96–106)
Creatinine, Ser: 0.59 mg/dL (ref 0.57–1.00)
GFR calc Af Amer: 113 mL/min/{1.73_m2} (ref >59)
GFR calc non Af Amer: 98 mL/min/{1.73_m2} (ref >59)
Glucose: 95 mg/dL (ref 65–99)
Potassium: 4 mmol/L (ref 3.5–5.2)
Sodium: 142 mmol/L (ref 134–144)

## 2020-03-14 LAB — CBC WITH DIFFERENTIAL/PLATELET
Basophils Absolute: 0 10*3/uL (ref 0.0–0.2)
Basos: 0 %
EOS (ABSOLUTE): 0.1 10*3/uL (ref 0.0–0.4)
Eos: 2 %
Hematocrit: 35.9 % (ref 34.0–46.6)
Hemoglobin: 12.9 g/dL (ref 11.1–15.9)
Immature Grans (Abs): 0 10*3/uL (ref 0.0–0.1)
Immature Granulocytes: 0 %
Lymphocytes Absolute: 2.6 10*3/uL (ref 0.7–3.1)
Lymphs: 38 %
MCH: 31.7 pg (ref 26.6–33.0)
MCHC: 35.9 g/dL — ABNORMAL HIGH (ref 31.5–35.7)
MCV: 88 fL (ref 79–97)
Monocytes Absolute: 0.5 10*3/uL (ref 0.1–0.9)
Monocytes: 7 %
Neutrophils Absolute: 3.6 10*3/uL (ref 1.4–7.0)
Neutrophils: 53 %
Platelets: 206 10*3/uL (ref 150–450)
RBC: 4.07 x10E6/uL (ref 3.77–5.28)
RDW: 12.2 % (ref 11.7–15.4)
WBC: 6.9 10*3/uL (ref 3.4–10.8)

## 2020-03-14 LAB — VITAMIN D 25 HYDROXY (VIT D DEFICIENCY, FRACTURES): Vit D, 25-Hydroxy: 34.3 ng/mL (ref 30.0–100.0)

## 2020-03-14 LAB — VITAMIN B12: Vitamin B-12: >2000 pg/mL — ABNORMAL HIGH (ref 232–1245)

## 2020-03-14 LAB — TSH: TSH: 2.14 u[IU]/mL (ref 0.450–4.500)

## 2020-04-12 ENCOUNTER — Ambulatory Visit
Admission: EM | Admit: 2020-04-12 | Discharge: 2020-04-12 | Disposition: A | Discharge status: Home / Self Care | Payer: 59 | Attending: Emergency Medicine | Admitting: Emergency Medicine

## 2020-04-12 ENCOUNTER — Other Ambulatory Visit: Payer: Self-pay

## 2020-04-12 ENCOUNTER — Encounter: Payer: Self-pay | Admitting: Emergency Medicine

## 2020-04-12 DIAGNOSIS — R220 Localized swelling, mass and lump, head: Secondary | ICD-10-CM

## 2020-04-12 DIAGNOSIS — J029 Acute pharyngitis, unspecified: Secondary | ICD-10-CM

## 2020-04-12 LAB — POCT RAPID STREP A (OFFICE): Rapid Strep A Screen: NEGATIVE

## 2020-04-12 MED ORDER — PREDNISONE 10 MG PO TABS: 20.0000 mg | ORAL_TABLET | Freq: Every day | ORAL | 0 refills | Status: AC

## 2020-04-12 NOTE — ED Triage Notes (Signed)
Woke up with sore throat then swelling to RT side of neck near chin that started today.  States it has went down some prior to arrival to urgent care.

## 2020-04-12 NOTE — Discharge Instructions (Signed)
Strep test negative, will send out for culture and we will call you with results Use OTC Halls or Cepacol lozenges to sooth throat Prednisone was prescribed for facial swelling Drink warm or cool liquids, use throat lozenges, or popsicles to help alleviate symptoms Take OTC ibuprofen or tylenol as needed for pain Follow up with PCP if symptoms persists Return or go to ER if patient has any new or worsening symptoms such as fever, chills, nausea, vomiting, worsening sore throat, cough, abdominal pain, chest pain, changes in bowel or bladder habits, etc..Marland Kitchen

## 2020-04-12 NOTE — ED Provider Notes (Signed)
[place]   [id number] 04/12/20 Arrival Time: 29  Chief Complaint  Patient presents with  . Sore Throat     SUBJECTIVE: History from: patient.  Kelly Espinoza is a 64 y.o. female who presents to the urgent care with frequent plaint of sore throat, right facial swelling that started today.  Denies sick exposure to strep, flu or mono, or precipitating event.  Has not tried any OTC medication.  Denies aggravating factors.  Denies previous symptoms in the past.  Denies chills, fever, nausea, vomiting, diarrhea   ROS: As per HPI.  All other pertinent ROS negative.     Past Medical History:  Diagnosis Date  . Congestion of nasal sinus    chronic  . Depression   . Hypoglycemia   . IBS (irritable bowel syndrome)    Past Surgical History:  Procedure Laterality Date  . BREAST BIOPSY Right 01/2016   Benign  . BREAST LUMPECTOMY WITH RADIOACTIVE SEED LOCALIZATION Right 01/16/2016   Procedure: RIGHT BREAST LUMPECTOMY WITH RADIOACTIVE SEED LOCALIZATION;  Surgeon: Coralie Keens, MD;  Location: [facility];  Service: General;  Laterality: Right;  . [place]   x 2  . cystoscopy  1977  . EAR CANALOPLASTY  1987   L ear and tympanoplasty, mastoidectomy.  Marland Kitchen TOTAL HIP ARTHROPLASTY     Left  . tube reversal  2005   reverse tubal ligation  . TYMPANOMASTOIDECTOMY  2000   R ear   Allergies  Allergen Reactions  . Amitriptyline Other (See Comments)    Loss of muscle control  . Codeine Diarrhea and Nausea And Vomiting  . Darvocet [Propoxyphene N-Acetaminophen] Nausea And Vomiting  . Donnatal [Belladonna Alk-Phenobarb Er] Other (See Comments)    Loss of muscle control  . Hydrocodone Diarrhea and Nausea And Vomiting  . Sulfonamide Derivatives Swelling  . Tramadol Other (See Comments)    Unknown    No current facility-administered medications on file prior to encounter.   Current Outpatient Medications on File Prior to Encounter    Medication Sig Dispense Refill  . cetirizine (ZYRTEC) 10 MG tablet Take 10 mg by mouth daily.    . citalopram (CELEXA) 10 MG tablet Take by mouth daily. Unsure of dosage    . [DISCONTINUED] buPROPion (WELLBUTRIN XL) 150 MG 24 hr tablet TAKE TWO TABLETS BY MOUTH ONCE DAILY 60 tablet 2  . [DISCONTINUED] metoprolol tartrate (LOPRESSOR) 25 MG tablet Take 1 tablet (25 mg total) by mouth 2 (two) times daily. 60 tablet 2  . [DISCONTINUED] QUEtiapine (SEROQUEL) 100 MG tablet TAKE THREE TABLETS BY MOUTH ONCE DAILY AT BEDTIME 90 tablet 0   Social History   Socioeconomic History  . Marital status: Married    Spouse name: Not on file  . Number of children: 2  . Years of education: Not on file  . Highest education level: Not on file  Occupational History    Employer: [employer]  Tobacco Use  . Smoking status: Never Smoker  . Smokeless tobacco: Never Used  Substance and Sexual Activity  . Alcohol use: No  . Drug use: No  . Sexual activity: Yes    Birth control/protection: None  Other Topics Concern  . Not on file  Social History Narrative   Lives with husband and nephew.     Social Determinants of Health   Financial Resource Strain:   . Difficulty of Paying Living Expenses: Not on file  Food Insecurity:   . Worried About  Running Out of Food in the Last Year: Not on file  . Ran Out of Food in the Last Year: Not on file  Transportation Needs:   . Lack of Transportation (Medical): Not on file  . Lack of Transportation (Non-Medical): Not on file  Physical Activity:   . Days of Exercise per Week: Not on file  . Minutes of Exercise per Session: Not on file  Stress:   . Feeling of Stress : Not on file  Social Connections:   . Frequency of Communication with Friends and Family: Not on file  . Frequency of Social Gatherings with Friends and Family: Not on file  . Attends Religious Services: Not on file  . Active Member of Clubs or Organizations: Not on file  . Attends English as a second language teacher Meetings: Not on file  . Marital Status: Not on file  Intimate Partner Violence:   . Fear of Current or Ex-Partner: Not on file  . Emotionally Abused: Not on file  . Physically Abused: Not on file  . Sexually Abused: Not on file   Family History  Problem Relation Age of Onset  . Breast cancer Mother   . Parkinson's disease Mother     OBJECTIVE:  Vitals:   04/12/20 1501 04/12/20 1502  BP: 114/72   Pulse: (!) 101   Resp: 17   Temp: 98.3 F (36.8 C)   TempSrc: Oral   SpO2: 96%   Weight:  150 lb (68 kg)  Height:  5' (1.524 m)     Physical Exam Vitals and nursing note reviewed.  Constitutional:      General: She is not in acute distress.    Appearance: Normal appearance. She is normal weight. She is not ill-appearing, toxic-appearing or diaphoretic.  HENT:     Head: Normocephalic.     Comments: Facial swelling present right side    Right Ear: Tympanic membrane, ear canal and external ear normal. There is no impacted cerumen.     Left Ear: Tympanic membrane, ear canal and external ear normal. There is no impacted cerumen.     Mouth/Throat:     Lips: Pink.     Mouth: Mucous membranes are moist.     Dentition: No dental tenderness, gingival swelling, dental caries or dental abscesses.  Cardiovascular:     Rate and Rhythm: Normal rate and regular rhythm.     Pulses: Normal pulses.     Heart sounds: Normal heart sounds. No murmur heard.  No friction rub. No gallop.   Pulmonary:     Effort: Pulmonary effort is normal. No respiratory distress.     Breath sounds: Normal breath sounds. No stridor. No wheezing, rhonchi or rales.  Chest:     Chest wall: No tenderness.  Neurological:     Mental Status: She is alert and oriented to person, place, and time.     LABS: Results for orders placed or performed during the hospital encounter of 04/12/20 (from the past 24 hour(s))  POCT rapid strep A     Status: None   Collection Time: 04/12/20  3:47 PM  Result  Value Ref Range   Rapid Strep A Screen Negative Negative     ASSESSMENT & PLAN:  1. Sore throat   2. Facial swelling     Meds ordered this encounter  Medications  . predniSONE (DELTASONE) 10 MG tablet    Sig: Take 2 tablets (20 mg total) by mouth daily.    Dispense:  15 tablet  Refill:  0    Discharge instructions  Strep test negative, will send out for culture and we will call you with results Use OTC Halls or Cepacol lozenges to sooth throat Prednisone was prescribed for facial swelling Drink warm or cool liquids, use throat lozenges, or popsicles to help alleviate symptoms Take OTC ibuprofen or tylenol as needed for pain Follow up with PCP if symptoms persists Return or go to ER if patient has any new or worsening symptoms such as fever, chills, nausea, vomiting, worsening sore throat, cough, abdominal pain, chest pain, changes in bowel or bladder habits, etc...  Reviewed expectations re: course of current medical issues. Questions answered. Outlined signs and symptoms indicating need for more acute intervention. Patient verbalized understanding. After Visit Summary given.         Emerson Monte, [place] 04/12/20 1604

## 2020-04-14 LAB — CULTURE, GROUP A STREP (THRC)

## 2020-04-15 LAB — CULTURE, GROUP A STREP (THRC)
